# Patient Record
Sex: Female | Born: 2000 | Race: Black or African American | Hispanic: No | Marital: Single | Smoking: Never smoker
Health system: Southern US, Community
[De-identification: ages and names within clinical notes are randomized; demographics above are authoritative.]

## PROBLEM LIST (undated history)

## (undated) DIAGNOSIS — G43909 Migraine, unspecified, not intractable, without status migrainosus: Secondary | ICD-10-CM

## (undated) DIAGNOSIS — B9689 Other specified bacterial agents as the cause of diseases classified elsewhere: Secondary | ICD-10-CM

## (undated) DIAGNOSIS — N760 Acute vaginitis: Secondary | ICD-10-CM

## (undated) HISTORY — PX: NO PAST SURGERIES: SHX2092

---
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
## 2018-08-13 ENCOUNTER — Emergency Department (HOSPITAL_COMMUNITY)
Admission: EM | Admit: 2018-08-13 | Discharge: 2018-08-14 | Disposition: A | Discharge status: Home / Self Care | Payer: PRIVATE HEALTH INSURANCE | Attending: Emergency Medicine | Admitting: Emergency Medicine

## 2018-08-13 ENCOUNTER — Emergency Department (HOSPITAL_COMMUNITY): Payer: PRIVATE HEALTH INSURANCE

## 2018-08-13 ENCOUNTER — Other Ambulatory Visit: Payer: Self-pay

## 2018-08-13 DIAGNOSIS — J111 Influenza due to unidentified influenza virus with other respiratory manifestations: Secondary | ICD-10-CM | POA: Diagnosis not present

## 2018-08-13 DIAGNOSIS — Z79899 Other long term (current) drug therapy: Secondary | ICD-10-CM | POA: Insufficient documentation

## 2018-08-13 DIAGNOSIS — R69 Illness, unspecified: Secondary | ICD-10-CM

## 2018-08-13 DIAGNOSIS — M7918 Myalgia, other site: Secondary | ICD-10-CM | POA: Diagnosis present

## 2018-08-13 MED ORDER — ACETAMINOPHEN 325 MG PO TABS
650.0000 mg | ORAL_TABLET | Freq: Once | ORAL | Status: AC | PRN
  Administered 2018-08-13: 650 mg via ORAL
  Filled 2018-08-13: qty 2

## 2018-08-13 NOTE — ED Triage Notes (Signed)
Patient c/o body aches x 1 day, fever, HA and neck pain.

## 2018-08-14 LAB — GROUP A STREP BY PCR: Group A Strep by PCR: NOT DETECTED

## 2018-08-14 MED ORDER — OSELTAMIVIR PHOSPHATE 75 MG PO CAPS: 75.0000 mg | ORAL_CAPSULE | Freq: Two times a day (BID) | ORAL | 0 refills | Status: DC

## 2018-08-14 MED ORDER — NAPROXEN 500 MG PO TABS: 500.0000 mg | ORAL_TABLET | Freq: Two times a day (BID) | ORAL | 0 refills | Status: DC

## 2018-08-14 MED ORDER — OSELTAMIVIR PHOSPHATE 6 MG/ML PO SUSR: 75.0000 mg | Freq: Two times a day (BID) | ORAL | 0 refills | Status: AC

## 2018-08-14 NOTE — ED Notes (Signed)
Patient requesting rx for liquid tamiful, PA geiple made aware

## 2018-08-14 NOTE — ED Notes (Signed)
Mom called, wants an update when pt is brought to a room, at [phone]

## 2018-08-14 NOTE — ED Notes (Signed)
Patient verbalized understanding of dc papers and Rxs, vss, ambulatory upon discharge.

## 2018-08-14 NOTE — Discharge Instructions (Signed)
Please read and follow all provided instructions.  Your diagnoses today include:  1. Influenza-like illness     Tests performed today include:  Chest x-ray - no pneumonia  Strep test - negative  Vital signs. See below for your results today.   Medications prescribed:   Tamiflu - medication for influenza  This medication, when taken within the first 48 hours of illness, may help decrease the severity of the flu and cause symptoms to improve approximately 12 hours sooner. About 1 out of 5 patients may have diarrhea and vomiting from this medication.    Naproxen - anti-inflammatory pain medication  Do not exceed 500mg  naproxen every 12 hours, take with food  You have been prescribed an anti-inflammatory medication or NSAID. Take with food. Take smallest effective dose for the shortest duration needed for your pain. Stop taking if you experience stomach pain or vomiting.   Take any prescribed medications only as directed.  Home care instructions:  Follow any educational materials contained in this packet. Please continue drinking plenty of fluids. Use over-the-counter cold and flu medications as needed as directed on packaging for symptom relief. You may also use ibuprofen or tylenol as directed on packaging for pain or fever.   BE VERY CAREFUL not to take multiple medicines containing Tylenol (also called acetaminophen). Doing so can lead to an overdose which can damage your liver and cause liver failure and possibly death.   Follow-up instructions: Please follow-up with your primary care provider in the next 3 days for further evaluation of your symptoms.   Return instructions:   Please return to the Emergency Department if you experience worsening symptoms.  Please return if you have a high fever greater than 101 degrees not controlled with over-the-counter medications, persistent vomiting and cannot keep down fluids, or worsening trouble breathing.  Please return if you  have any other emergent concerns.  Additional Information:  Your vital signs today were: BP 105/66 (BP Location: Right Arm)    Pulse 73    Temp 98.5 F (36.9 C) (Oral)    Resp 17    Wt 70.8 kg    LMP  (LMP Unknown)    SpO2 98%    BMI 31.51 kg/m  If your blood pressure (BP) was elevated above 135/85 this visit, please have this repeated by your doctor within one month.

## 2018-08-14 NOTE — ED Provider Notes (Signed)
[facility] Provider Note   CSN: [id number] Arrival date & time: 08/13/18  2244     History   Chief Complaint Chief Complaint  Patient presents with  . Generalized Body Aches    HPI Sandra Herman is a 18 y.o. female.  Patient with no significant past medical history presents the emergency department with 1 day of fevers, congestion, generalized body aches.  Patient has not had any significant cough, nausea, vomiting, or diarrhea.  She denies ear pain.  No treatments prior to arrival.  No known sick contacts.  Patient denies neck pain, headache, or confusion. The onset of this condition was acute. The course is constant. Aggravating factors: none. Alleviating factors: none.       No past medical history on file.  There are no active problems to display for this patient.   No past surgical history on file.   OB History   No obstetric history on file.      Home Medications    Prior to Admission medications   Medication Sig Start Date End Date Taking? Authorizing Provider  acetaminophen (TYLENOL) 500 MG tablet Take 1,000 mg by mouth every 6 (six) hours as needed for headache (pain).    [provider]  etonogestrel (NEXPLANON) 68 MG IMPL implant 1 each by Subdermal route once. Implanted April 2018    [provider]  metroNIDAZOLE (FLAGYL) 500 MG tablet Take 1 tablet (500 mg total) by mouth 2 (two) times daily. 08/27/17   Hedges, Tinnie GensJeffrey, PA-C  naproxen (NAPROSYN) 500 MG tablet Take 1 tablet (500 mg total) by mouth 2 (two) times daily. 08/14/18   Renne CriglerGeiple, Chivas Notz, PA-C  oseltamivir (TAMIFLU) 75 MG capsule Take 1 capsule (75 mg total) by mouth every 12 (twelve) hours. 08/14/18   Renne CriglerGeiple, Channon Ambrosini, PA-C    Family History No family history on file.  Social History Social History   Tobacco Use  . Smoking status: Never Smoker  . Smokeless tobacco: Never Used  Substance Use Topics  . Alcohol use: No    Frequency:  Never  . Drug use: No     Allergies   Patient has no known allergies.   Review of Systems Review of Systems  Constitutional: Positive for chills, fatigue and fever.  HENT: Positive for congestion and sore throat. Negative for ear pain, rhinorrhea and sinus pressure.   Eyes: Negative for redness.  Respiratory: Negative for cough, shortness of breath and wheezing.   Gastrointestinal: Negative for abdominal pain, diarrhea, nausea and vomiting.  Genitourinary: Negative for dysuria.  Musculoskeletal: Positive for myalgias. Negative for neck stiffness.  Skin: Negative for rash.  Neurological: Negative for headaches.  Hematological: Negative for adenopathy.     Physical Exam Updated Vital Signs BP 105/66 (BP Location: Right Arm)   Pulse 73   Temp 98.5 F (36.9 C) (Oral)   Resp 17   Wt 70.8 kg   LMP  (LMP Unknown)   SpO2 98%   BMI 31.51 kg/m   Physical Exam Vitals signs and nursing note reviewed.  Constitutional:      Appearance: She is well-developed.  HENT:     Head: Normocephalic and atraumatic.     Jaw: No trismus.     Right Ear: Tympanic membrane, ear canal and external ear normal.     Left Ear: Tympanic membrane, ear canal and external ear normal.     Nose: Nose normal. No mucosal edema or rhinorrhea.     Mouth/Throat:  Mouth: Mucous membranes are not dry. No oral lesions.     Pharynx: Uvula midline. No oropharyngeal exudate, posterior oropharyngeal erythema or uvula swelling.     Tonsils: No tonsillar abscesses.  Eyes:     General:        Right eye: No discharge.        Left eye: No discharge.     Conjunctiva/sclera: Conjunctivae normal.  Neck:     Musculoskeletal: Normal range of motion and neck supple.     Comments: No signs of meningismus.  Cardiovascular:     Rate and Rhythm: Normal rate and regular rhythm.     Heart sounds: Normal heart sounds.  Pulmonary:     Effort: Pulmonary effort is normal. No respiratory distress.     Breath sounds: Normal  breath sounds. No wheezing or rales.  Abdominal:     Palpations: Abdomen is soft.     Tenderness: There is no abdominal tenderness.  Lymphadenopathy:     Cervical: No cervical adenopathy.  Skin:    General: Skin is warm and dry.  Neurological:     Mental Status: She is alert.      ED Treatments / Results  Labs (all labs ordered are listed, but only abnormal results are displayed) Labs Reviewed  GROUP A STREP BY PCR    EKG None  Radiology Dg Chest 2 View  Result Date: 08/13/2018 CLINICAL DATA:  Congestion EXAM: CHEST - 2 VIEW COMPARISON:  06/11/2017 FINDINGS: Heart and mediastinal contours are within normal limits. No focal opacities or effusions. No acute bony abnormality. IMPRESSION: No active cardiopulmonary disease. Electronically Signed   By: Charlett Nose M.D.   On: 08/13/2018 23:48    Procedures Procedures (including critical care time)  Medications Ordered in ED Medications  acetaminophen (TYLENOL) tablet 650 mg (650 mg Oral Given 08/13/18 2315)     Initial Impression / Assessment and Plan / ED Course  I have reviewed the triage vital signs and the nursing notes.  Pertinent labs & imaging results that were available during my care of the patient were reviewed by me and considered in my medical decision making (see chart for details).     Patient seen and examined.  Reviewed chest x-ray and strep results with patient.  Clinically she has the flu.  No worrisome signs of meningitis.  Heart and lungs are clear.  Vital signs reviewed and are as follows: BP 105/66 (BP Location: Right Arm)   Pulse 73   Temp 98.5 F (36.9 C) (Oral)   Resp 17   Wt 70.8 kg   LMP  (LMP Unknown)   SpO2 98%   BMI 31.51 kg/m   Patient will be treated for influenza presumptively.  Encouraged to rest and drink plenty of fluids.  Patient told to return to ED or see their primary doctor if their symptoms worsen, high fever not controlled with tylenol, persistent vomiting, they feel they  are dehydrated, or if they have any other concerns.  Patient verbalized understanding and agreed with plan.     Final Clinical Impressions(s) / ED Diagnoses   Final diagnoses:  Influenza-like illness   Patient with symptoms consistent with influenza. Vitals are stable, low-grade fever, improved. No signs of dehydration, tolerating PO's. Lungs are clear. Supportive therapy indicated with return if symptoms worsen. Patient counseled.   ED Discharge Orders         Ordered    oseltamivir (TAMIFLU) 75 MG capsule  Every 12 hours     08/14/18  0711    naproxen (NAPROSYN) 500 MG tablet  2 times daily     08/14/18 0711           Renne CriglerGeiple, Jenille Laszlo, PA-C 08/14/18 Meda [id number]    Isaacs, Cameron, MD 08/14/18 [phone]

## 2019-04-12 ENCOUNTER — Emergency Department (HOSPITAL_COMMUNITY)
Admission: EM | Admit: 2019-04-12 | Discharge: 2019-04-12 | Disposition: A | Discharge status: Home / Self Care | Payer: PRIVATE HEALTH INSURANCE | Attending: Emergency Medicine | Admitting: Emergency Medicine

## 2019-04-12 ENCOUNTER — Encounter (HOSPITAL_COMMUNITY): Payer: Self-pay | Admitting: *Deleted

## 2019-04-12 ENCOUNTER — Other Ambulatory Visit: Payer: Self-pay

## 2019-04-12 DIAGNOSIS — L509 Urticaria, unspecified: Secondary | ICD-10-CM | POA: Insufficient documentation

## 2019-04-12 DIAGNOSIS — R21 Rash and other nonspecific skin eruption: Secondary | ICD-10-CM | POA: Diagnosis present

## 2019-04-12 HISTORY — DX: Migraine, unspecified, not intractable, without status migrainosus: G43.909

## 2019-04-12 HISTORY — DX: Other specified bacterial agents as the cause of diseases classified elsewhere: B96.89

## 2019-04-12 MED ORDER — PREDNISONE 10 MG PO TABS: 40.0000 mg | ORAL_TABLET | Freq: Every day | ORAL | 0 refills | Status: AC

## 2019-04-12 MED ORDER — HYDROXYZINE HCL 25 MG PO TABS
25.0000 mg | ORAL_TABLET | Freq: Once | ORAL | Status: AC
  Administered 2019-04-12: 25 mg via ORAL
  Filled 2019-04-12: qty 1

## 2019-04-12 MED ORDER — PREDNISONE 20 MG PO TABS
60.0000 mg | ORAL_TABLET | Freq: Once | ORAL | Status: AC
  Administered 2019-04-12: 60 mg via ORAL
  Filled 2019-04-12: qty 3

## 2019-04-12 MED ORDER — HYDROXYZINE HCL 25 MG PO TABS: 25.0000 mg | ORAL_TABLET | Freq: Four times a day (QID) | ORAL | 0 refills | Status: AC

## 2019-04-12 NOTE — ED Triage Notes (Signed)
C/o rash over entire body x 1 weeks no change didn't come yest because she didn't want to wait.

## 2019-04-12 NOTE — Discharge Instructions (Signed)
You may use over-the-counter hydrocortisone cream on your skin for relief as well.  Take medication as prescribed.  Return if you have any new or worsening symptoms.

## 2019-04-12 NOTE — ED Provider Notes (Signed)
[place] EMERGENCY DEPARTMENT Provider Note   CSN: [id number] Arrival date & time: 04/12/19  1031     History   Chief Complaint Chief Complaint  Patient presents with  . Rash    HPI Sandra Herman is a 18 y.o. female.     Patient is a 18 year old female possible history of migraine headache presenting to emergency department for rash for 1 week.  Reports this started on reports.  Positive back pain, sometimes body.  Reports that it is very itchy and exacerbated by heat and warm water.  Has not tried any relief.  No known irritants or exposures to new soaps, lotions, detergents etc.  Denies any trouble breathing or swallowing.  Denies any wheezing.     Past Medical History:  Diagnosis Date  . BV (bacterial vaginosis)   . Migraine     There are no active problems to display for this patient.   History reviewed. No pertinent surgical history.   OB History   No obstetric history on file.      Home Medications    Prior to Admission medications   Medication Sig Start Date End Date Taking? Authorizing Provider  acetaminophen (TYLENOL) 500 MG tablet Take 1,000 mg by mouth every 6 (six) hours as needed for headache (pain).    [provider]  etonogestrel (NEXPLANON) 68 MG IMPL implant 1 each by Subdermal route once. Implanted April 2018    [provider]  hydrOXYzine (ATARAX/VISTARIL) 25 MG tablet Take 1 tablet (25 mg total) by mouth every 6 (six) hours for 7 days. 04/12/19 04/19/19  Madilyn Hook A, PA-C  metroNIDAZOLE (FLAGYL) 500 MG tablet Take 1 tablet (500 mg total) by mouth 2 (two) times daily. 08/27/17   Hedges, Dellis Filbert, PA-C  naproxen (NAPROSYN) 500 MG tablet Take 1 tablet (500 mg total) by mouth 2 (two) times daily. 08/14/18   Carlisle Cater, PA-C  predniSONE (DELTASONE) 10 MG tablet Take 4 tablets (40 mg total) by mouth daily for 5 days. 04/12/19 04/17/19  Alveria Apley, PA-C    Family History No family history on file.   Social History Social History   Tobacco Use  . Smoking status: Never Smoker  . Smokeless tobacco: Never Used  Substance Use Topics  . Alcohol use: Yes    Frequency: Never  . Drug use: No     Allergies   Patient has no known allergies.   Review of Systems Review of Systems  Constitutional: Negative for appetite change, chills, diaphoresis, fatigue and fever.  Respiratory: Negative for cough and shortness of breath.   Cardiovascular: Negative for chest pain.  Gastrointestinal: Negative for nausea and vomiting.  Genitourinary: Negative for dysuria.  Musculoskeletal: Negative for arthralgias, back pain and myalgias.  Skin: Positive for rash. Negative for color change, pallor and wound.  Neurological: Negative for dizziness and light-headedness.  All other systems reviewed and are negative.    Physical Exam Updated Vital Signs BP 122/76 (BP Location: Right Arm)   Pulse 93   Temp 98.4 F (36.9 C) (Oral)   Resp 16   Ht 4\' 11"  (1.499 m)   Wt 68 kg   SpO2 100%   BMI 30.30 kg/m   Physical Exam Vitals signs and nursing note reviewed.  Constitutional:      General: She is not in acute distress.    Appearance: Normal appearance. She is not ill-appearing, toxic-appearing or diaphoretic.  HENT:     Head: Normocephalic and atraumatic.  Nose: Nose normal.     Mouth/Throat:     Mouth: Mucous membranes are moist.     Pharynx: No oropharyngeal exudate or posterior oropharyngeal erythema.  Eyes:     Conjunctiva/sclera: Conjunctivae normal.  Pulmonary:     Effort: Pulmonary effort is normal.  Skin:    General: Skin is dry.     Comments: Multiple hives all over the patient's torso, arms and hands, inguinal area.  No signs of swelling, drainage, erythema.  Neurological:     Mental Status: She is alert.  Psychiatric:        Mood and Affect: Mood normal.      ED Treatments / Results  Labs (all labs ordered are listed, but only abnormal results are displayed) Labs  Reviewed - No data to display  EKG None  Radiology No results found.  Procedures Procedures (including critical care time)  Medications Ordered in ED Medications  predniSONE (DELTASONE) tablet 60 mg (has no administration in time range)  hydrOXYzine (ATARAX/VISTARIL) tablet 25 mg (has no administration in time range)     Initial Impression / Assessment and Plan / ED Course  I have reviewed the triage vital signs and the nursing notes.  Pertinent labs & imaging results that were available during my care of the patient were reviewed by me and considered in my medical decision making (see chart for details).  Clinical Course as of Apr 11 1200  Sat Apr 12, 2019  1159 Patient here for rash for 1 week.  Appears to be hives.  Unknown cause.  Patient does not have any signs of anaphylaxis.  Airway is normal.  Monocytes normal.  Will treat with steroids and Atarax.  Advised to follow-up with allergy specialist for allergy testing.   [KM]    Clinical Course User Index [KM] Arlyn Dunning, PA-C       Based on review of vitals, medical screening exam, lab work and/or imaging, there does not appear to be an acute, emergent etiology for the patient's symptoms. Counseled pt on good return precautions and encouraged both PCP and ED follow-up as needed.  Prior to discharge, I also discussed incidental imaging findings with patient in detail and advised appropriate, recommended follow-up in detail.  Clinical Impression: 1. Hives     Disposition: Discharge  Prior to providing a prescription for a controlled substance, I independently reviewed the patient's recent prescription history on the [place] Controlled Substance Reporting System. The patient had no recent or regular prescriptions and was deemed appropriate for a brief, less than 3 day prescription of narcotic for acute analgesia.  This note was prepared with assistance of Conservation officer, historic buildings. Occasional  wrong-word or sound-a-like substitutions may have occurred due to the inherent limitations of voice recognition software.   Final Clinical Impressions(s) / ED Diagnoses   Final diagnoses:  Hives    ED Discharge Orders         Ordered    predniSONE (DELTASONE) 10 MG tablet  Daily     04/12/19 1200    hydrOXYzine (ATARAX/VISTARIL) 25 MG tablet  Every 6 hours     04/12/19 1200           Jeral Pinch 04/12/19 1201    Arby Barrette, MD 04/22/19 [phone]

## 2019-05-02 ENCOUNTER — Ambulatory Visit: Payer: PRIVATE HEALTH INSURANCE | Admitting: Allergy

## 2019-05-23 ENCOUNTER — Other Ambulatory Visit: Payer: Self-pay

## 2019-05-23 ENCOUNTER — Ambulatory Visit: Payer: PRIVATE HEALTH INSURANCE | Admitting: Allergy

## 2019-09-12 ENCOUNTER — Other Ambulatory Visit: Payer: Self-pay

## 2019-09-12 ENCOUNTER — Encounter (HOSPITAL_COMMUNITY): Payer: Self-pay | Admitting: Emergency Medicine

## 2019-09-12 ENCOUNTER — Emergency Department (HOSPITAL_COMMUNITY)
Admission: EM | Admit: 2019-09-12 | Discharge: 2019-09-12 | Disposition: A | Discharge status: Home / Self Care | Payer: PRIVATE HEALTH INSURANCE | Attending: Emergency Medicine | Admitting: Emergency Medicine

## 2019-09-12 DIAGNOSIS — Z79899 Other long term (current) drug therapy: Secondary | ICD-10-CM | POA: Diagnosis not present

## 2019-09-12 DIAGNOSIS — Z793 Long term (current) use of hormonal contraceptives: Secondary | ICD-10-CM | POA: Insufficient documentation

## 2019-09-12 DIAGNOSIS — H9201 Otalgia, right ear: Secondary | ICD-10-CM | POA: Diagnosis present

## 2019-09-12 DIAGNOSIS — M26621 Arthralgia of right temporomandibular joint: Secondary | ICD-10-CM | POA: Diagnosis not present

## 2019-09-12 MED ORDER — NAPROXEN 500 MG PO TABS: 500.0000 mg | ORAL_TABLET | Freq: Two times a day (BID) | ORAL | 0 refills | Status: DC

## 2019-09-12 NOTE — ED Provider Notes (Signed)
[place] EMERGENCY DEPARTMENT Provider Note   CSN: [id number] Arrival date & time: 09/12/19  1327     History Chief Complaint  Patient presents with  . Otalgia    Kennadie Brenner is a 19 y.o. female.  Tamaria Dunleavy is a 19 y.o. female with a history of migraines, who presents to the emergency department for evaluation of pain in front of her right ear.  She reports pain has been present for about 3 weeks.  She reports she hears clicking and popping and pain is worse when she is trying to chew and eat.  She denies pain inside the ear reports pain is more so in front of her ear at the jaw joint.  She is also had some associated headaches, no numbness tingling or weakness of the face.  No visual changes.  No dental pain or sore throat.  No fevers or chills.  No redness or swelling of the skin in this area.  She has tried ibuprofen without much relief.  She also was told recently by her mom that she grinds her teeth in her sleep and wonders if this could be contributing.  She is here for school and her dentist is in [place], she has not seen anyone about this complaint.  The history is provided by the patient.       Past Medical History:  Diagnosis Date  . BV (bacterial vaginosis)   . Migraine     There are no problems to display for this patient.   History reviewed. No pertinent surgical history.   OB History   No obstetric history on file.     No family history on file.  Social History   Tobacco Use  . Smoking status: Never Smoker  . Smokeless tobacco: Never Used  Substance Use Topics  . Alcohol use: Not Currently  . Drug use: No    Home Medications Prior to Admission medications   Medication Sig Start Date End Date Taking? Authorizing Provider  acetaminophen (TYLENOL) 500 MG tablet Take 1,000 mg by mouth every 6 (six) hours as needed for headache (pain).    [provider]  etonogestrel (NEXPLANON) 68 MG IMPL implant 1 each by  Subdermal route once. Implanted April 2018    [provider]  metroNIDAZOLE (FLAGYL) 500 MG tablet Take 1 tablet (500 mg total) by mouth 2 (two) times daily. 08/27/17   Hedges, Dellis Filbert, PA-C  naproxen (NAPROSYN) 500 MG tablet Take 1 tablet (500 mg total) by mouth 2 (two) times daily. 09/12/19   Jacqlyn Larsen, PA-C    Allergies    Patient has no known allergies.  Review of Systems   Review of Systems  Constitutional: Negative for chills and fever.  HENT: Positive for ear pain. Negative for congestion, ear discharge, rhinorrhea, sore throat and trouble swallowing.   Gastrointestinal: Negative for nausea and vomiting.  Musculoskeletal: Negative for neck pain and neck stiffness.  Skin: Negative for color change and rash.  Neurological: Positive for headaches.    Physical Exam Updated Vital Signs BP 126/69 (BP Location: Left Arm)   Pulse 72   Temp 97.7 F (36.5 C) (Oral)   Resp 16   Ht 4\' 11"  (1.499 m)   Wt 72.6 kg   SpO2 99%   BMI 32.32 kg/m   Physical Exam Vitals and nursing note reviewed.  Constitutional:      General: She is not in acute distress.    Appearance: Normal appearance. She is well-developed and  normal weight. She is not ill-appearing or diaphoretic.  HENT:     Head: Normocephalic and atraumatic.     Right Ear: Tympanic membrane, ear canal and external ear normal.     Left Ear: Tympanic membrane, ear canal and external ear normal.     Mouth/Throat:     Comments: Tenderness over the right TMJ joint, popping felt with movement of the jaw, no erythema swelling or abscesses noted in the mouth, no sublingual tenderness, no trismus.  No skin changes over the TMJ joint, no palpable fluctuance or abscess. Eyes:     General:        Right eye: No discharge.        Left eye: No discharge.  Pulmonary:     Effort: Pulmonary effort is normal. No respiratory distress.  Musculoskeletal:     Cervical back: Neck supple.  Skin:    General: Skin is warm and dry.    Neurological:     Mental Status: She is alert and oriented to person, place, and time.     Coordination: Coordination normal.  Psychiatric:        Behavior: Behavior normal.     ED Results / Procedures / Treatments   Labs (all labs ordered are listed, but only abnormal results are displayed) Labs Reviewed - No data to display  EKG None  Radiology No results found.  Procedures Procedures (including critical care time)  Medications Ordered in ED Medications - No data to display  ED Course  I have reviewed the triage vital signs and the nursing notes.  Pertinent labs & imaging results that were available during my care of the patient were reviewed by me and considered in my medical decision making (see chart for details).    MDM Rules/Calculators/A&P                      19 year old female presents with tenderness over the TMJ joint associated with popping and clicking, pain is worse with chewing.  She has no overlying swelling or skin changes and no evidence of abscess or infection in the mouth.  No associated neck pain or lymphadenopathy.  No evidence of ear infection.  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  I have asked the patient to take naproxen regularly and purchase an over-the-counter mouthguard and encouraged her to follow-up with her dentist.  Return precautions discussed.  Discharged home in good condition.  Final Clinical Impression(s) / ED Diagnoses Final diagnoses:  TMJ tenderness, right    Rx / DC Orders ED Discharge Orders         Ordered    naproxen (NAPROSYN) 500 MG tablet  2 times daily     09/12/19 1519           Legrand Rams 09/12/19 1934    Jacalyn Lefevre, MD 09/18/19 1200

## 2019-09-12 NOTE — ED Triage Notes (Signed)
C/o right ear pain-- started approx 3 weeks ago-- states her mom told her that she grinds her teeth at night. Hurts to chew also

## 2019-09-12 NOTE — Discharge Instructions (Signed)
I suspect your pain is due to inflammation in your TMJ joint, this can certainly be worsened by teeth grinding.  Please purchase an over-the-counter mouthguard.  Please also take naproxen twice daily with food.  It is important that you follow-up with your dentist if you are continuing to have the symptoms.

## 2020-03-18 ENCOUNTER — Encounter (HOSPITAL_COMMUNITY): Payer: Self-pay

## 2020-03-18 ENCOUNTER — Emergency Department (HOSPITAL_COMMUNITY)
Admission: EM | Admit: 2020-03-18 | Discharge: 2020-03-18 | Disposition: A | Discharge status: Home / Self Care | Payer: PRIVATE HEALTH INSURANCE | Attending: Emergency Medicine | Admitting: Emergency Medicine

## 2020-03-18 DIAGNOSIS — Z5321 Procedure and treatment not carried out due to patient leaving prior to being seen by health care provider: Secondary | ICD-10-CM | POA: Insufficient documentation

## 2020-03-18 DIAGNOSIS — M791 Myalgia, unspecified site: Secondary | ICD-10-CM | POA: Diagnosis not present

## 2020-03-18 LAB — BASIC METABOLIC PANEL
Anion gap: 10 (ref 5–15)
BUN: 9 mg/dL (ref 6–20)
CO2: 24 mmol/L (ref 22–32)
Calcium: 8.9 mg/dL (ref 8.9–10.3)
Chloride: 103 mmol/L (ref 98–111)
Creatinine, Ser: 0.87 mg/dL (ref 0.44–1.00)
GFR calc Af Amer: >60 mL/min (ref >60)
GFR calc non Af Amer: >60 mL/min (ref >60)
Glucose, Bld: 93 mg/dL (ref 70–99)
Potassium: 3.6 mmol/L (ref 3.5–5.1)
Sodium: 137 mmol/L (ref 135–145)

## 2020-03-18 NOTE — ED Notes (Signed)
Pt told registration they were leaving 

## 2020-03-18 NOTE — ED Triage Notes (Signed)
Pt reports generalized body aches and facial pain for the past week, headaches. Was tested for covid and told it was negative today. Hx of migraines

## 2020-03-19 LAB — CBC
HCT: 37.8 % (ref 36.0–46.0)
Hemoglobin: 11.8 g/dL — ABNORMAL LOW (ref 12.0–15.0)
MCH: 28.9 pg (ref 26.0–34.0)
MCHC: 31.2 g/dL (ref 30.0–36.0)
MCV: 92.6 fL (ref 80.0–100.0)
Platelets: 177 10*3/uL (ref 150–400)
RBC: 4.08 MIL/uL (ref 3.87–5.11)
RDW: 11.9 % (ref 11.5–15.5)
WBC: 4.3 10*3/uL (ref 4.0–10.5)
nRBC: 0 % (ref 0.0–0.2)

## 2020-05-11 ENCOUNTER — Other Ambulatory Visit: Payer: Self-pay

## 2020-05-11 DIAGNOSIS — O3680X0 Pregnancy with inconclusive fetal viability, not applicable or unspecified: Secondary | ICD-10-CM

## 2020-05-13 ENCOUNTER — Telehealth: Payer: Self-pay

## 2020-05-13 NOTE — Telephone Encounter (Signed)
Called pt and spoke with her in regards to f/u from [facility].  Pt stated that she came to [address] In which from someone from the front office scheduled her an U/S appt for 05/24/20 @ 1500.  Pt denies vaginal bleeding or pain.  Pt advised that we will determine her f/u after her U/S appt  Pt also advised that if she starts bleeding like a period or has intense pain to please go to [facility].  Pt verbalized understanding with no further questions.   Addison Naegeli, RN  05/13/20

## 2020-05-18 ENCOUNTER — Inpatient Hospital Stay (HOSPITAL_COMMUNITY): Payer: PRIVATE HEALTH INSURANCE

## 2020-05-18 ENCOUNTER — Encounter (HOSPITAL_COMMUNITY): Payer: Self-pay | Admitting: Obstetrics and Gynecology

## 2020-05-18 ENCOUNTER — Inpatient Hospital Stay (HOSPITAL_COMMUNITY)
Admission: AD | Admit: 2020-05-18 | Discharge: 2020-05-18 | Disposition: A | Discharge status: Home / Self Care | Payer: PRIVATE HEALTH INSURANCE | Attending: Obstetrics and Gynecology | Admitting: Obstetrics and Gynecology

## 2020-05-18 ENCOUNTER — Other Ambulatory Visit: Payer: Self-pay

## 2020-05-18 ENCOUNTER — Telehealth: Payer: Self-pay | Admitting: Nurse Practitioner

## 2020-05-18 DIAGNOSIS — Z3A01 Less than 8 weeks gestation of pregnancy: Secondary | ICD-10-CM | POA: Diagnosis not present

## 2020-05-18 DIAGNOSIS — O039 Complete or unspecified spontaneous abortion without complication: Secondary | ICD-10-CM | POA: Diagnosis not present

## 2020-05-18 DIAGNOSIS — O209 Hemorrhage in early pregnancy, unspecified: Secondary | ICD-10-CM | POA: Insufficient documentation

## 2020-05-18 DIAGNOSIS — Z6790 Unspecified blood type, Rh positive: Secondary | ICD-10-CM | POA: Diagnosis not present

## 2020-05-18 DIAGNOSIS — O4690 Antepartum hemorrhage, unspecified, unspecified trimester: Secondary | ICD-10-CM

## 2020-05-18 DIAGNOSIS — O4691 Antepartum hemorrhage, unspecified, first trimester: Secondary | ICD-10-CM

## 2020-05-18 DIAGNOSIS — Z3490 Encounter for supervision of normal pregnancy, unspecified, unspecified trimester: Secondary | ICD-10-CM

## 2020-05-18 LAB — COMPREHENSIVE METABOLIC PANEL
ALT: 25 U/L (ref 0–44)
AST: 25 U/L (ref 15–41)
Albumin: 3.9 g/dL (ref 3.5–5.0)
Alkaline Phosphatase: 40 U/L (ref 38–126)
Anion gap: 6 (ref 5–15)
BUN: 11 mg/dL (ref 6–20)
CO2: 27 mmol/L (ref 22–32)
Calcium: 9.4 mg/dL (ref 8.9–10.3)
Chloride: 104 mmol/L (ref 98–111)
Creatinine, Ser: 0.66 mg/dL (ref 0.44–1.00)
GFR, Estimated: >60 mL/min (ref >60)
Glucose, Bld: 92 mg/dL (ref 70–99)
Potassium: 3.7 mmol/L (ref 3.5–5.1)
Sodium: 137 mmol/L (ref 135–145)
Total Bilirubin: 0.4 mg/dL (ref 0.3–1.2)
Total Protein: 7.7 g/dL (ref 6.5–8.1)

## 2020-05-18 LAB — URINALYSIS, ROUTINE W REFLEX MICROSCOPIC
Bilirubin Urine: NEGATIVE
Glucose, UA: NEGATIVE mg/dL
Ketones, ur: NEGATIVE mg/dL
Leukocytes,Ua: NEGATIVE
Nitrite: NEGATIVE
Protein, ur: NEGATIVE mg/dL
Specific Gravity, Urine: >1.03 — ABNORMAL HIGH (ref 1.005–1.030)
pH: 5.5 (ref 5.0–8.0)

## 2020-05-18 LAB — CBC
HCT: 37 % (ref 36.0–46.0)
Hemoglobin: 12 g/dL (ref 12.0–15.0)
MCH: 29.9 pg (ref 26.0–34.0)
MCHC: 32.4 g/dL (ref 30.0–36.0)
MCV: 92.3 fL (ref 80.0–100.0)
Platelets: 222 10*3/uL (ref 150–400)
RBC: 4.01 MIL/uL (ref 3.87–5.11)
RDW: 12.6 % (ref 11.5–15.5)
WBC: 11.5 10*3/uL — ABNORMAL HIGH (ref 4.0–10.5)
nRBC: 0 % (ref 0.0–0.2)

## 2020-05-18 LAB — HCG, QUANTITATIVE, PREGNANCY: hCG, Beta Chain, Quant, S: 4369 m[IU]/mL — ABNORMAL HIGH (ref <5)

## 2020-05-18 LAB — WET PREP, GENITAL
Clue Cells Wet Prep HPF POC: NONE SEEN
Sperm: NONE SEEN
Trich, Wet Prep: NONE SEEN
Yeast Wet Prep HPF POC: NONE SEEN

## 2020-05-18 LAB — URINALYSIS, MICROSCOPIC (REFLEX): RBC / HPF: NONE SEEN RBC/hpf (ref 0–5)

## 2020-05-18 LAB — ABO/RH: ABO/RH(D): B POS

## 2020-05-18 LAB — POCT PREGNANCY, URINE: Preg Test, Ur: POSITIVE — AB

## 2020-05-18 NOTE — Discharge Instructions (Signed)
Miscarriage A miscarriage is the loss of an unborn baby (fetus) before the 20th week of pregnancy. Most miscarriages happen during the first 3 months of pregnancy. Sometimes, a miscarriage can happen before a woman knows that she is pregnant. Having a miscarriage can be an emotional experience. If you have had a miscarriage, talk with your health care provider about any questions you may have about miscarrying, the grieving process, and your plans for future pregnancy. What are the causes? A miscarriage may be caused by:  Problems with the genes or chromosomes of the fetus. These problems make it impossible for the baby to develop normally. They are often the result of random errors that occur early in the development of the baby, and are not passed from parent to child (not inherited).  Infection of the cervix or uterus.  Conditions that affect hormone balance in the body.  Problems with the cervix, such as the cervix opening and thinning before pregnancy is at term (cervical insufficiency).  Problems with the uterus. These may include: ? A uterus with an abnormal shape. ? Fibroids in the uterus. ? Congenital abnormalities. These are problems that were present at birth.  Certain medical conditions.  Smoking, drinking alcohol, or using drugs.  Injury (trauma). In many cases, the cause of a miscarriage is not known. What are the signs or symptoms? Symptoms of this condition include:  Vaginal bleeding or spotting, with or without cramps or pain.  Pain or cramping in the abdomen or lower back.  Passing fluid, tissue, or blood clots from the vagina. How is this diagnosed? This condition may be diagnosed based on:  A physical exam.  Ultrasound.  Blood tests.  Urine tests. How is this treated? Treatment for a miscarriage is sometimes not necessary if you naturally pass all the tissue that was in your uterus. If necessary, this condition may be treated with:  Dilation and  curettage (D&C). This is a procedure in which the cervix is stretched open and the lining of the uterus (endometrium) is scraped. This is done only if tissue from the fetus or placenta remains in the body (incomplete miscarriage).  Medicines, such as: ? Antibiotic medicine, to treat infection. ? Medicine to help the body pass any remaining tissue. ? Medicine to reduce (contract) the size of the uterus. These medicines may be given if you have a lot of bleeding. If you have Rh negative blood and your baby was Rh positive, you will need a shot of a medicine called Rh immunoglobulinto protect your future babies from Rh blood problems. "Rh-negative" and "Rh-positive" refer to whether or not the blood has a specific protein found on the surface of red blood cells (Rh factor). Follow these instructions at home: Medicines   Take over-the-counter and prescription medicines only as told by your health care provider.  If you were prescribed antibiotic medicine, take it as told by your health care provider. Do not stop taking the antibiotic even if you start to feel better.  Do not take NSAIDs, such as aspirin and ibuprofen, unless they are approved by your health care provider. These medicines can cause bleeding. Activity  Rest as directed. Ask your health care provider what activities are safe for you.  Have someone help with home and family responsibilities during this time. General instructions  Keep track of the number of sanitary pads you use each day and how soaked (saturated) they are. Write down this information.  Monitor the amount of tissue or blood clots that   you pass from your vagina. Save any large amounts of tissue for your health care provider to examine.  Do not use tampons, douche, or have sex until your health care provider approves.  To help you and your partner with the process of grieving, talk with your health care provider or seek counseling.  When you are ready, meet with  your health care provider to discuss any important steps you should take for your health. Also, discuss steps you should take to have a healthy pregnancy in the future.  Keep all follow-up visits as told by your health care provider. This is important. Where to find more information  The American Congress of Obstetricians and Gynecologists: www.acog.org  [place] Department of Health and Cytogeneticist of Women's Health: http://hoffman.com/ Contact a health care provider if:  You have a fever or chills.  You have a foul smelling vaginal discharge.  You have more bleeding instead of less. Get help right away if:  You have severe cramps or pain in your back or abdomen.  You pass blood clots or tissue from your vagina that is walnut-sized or larger.  You soak more than 1 regular sanitary pad in an hour.  You become light-headed or weak.  You pass out.  You have feelings of sadness that take over your thoughts, or you have thoughts of hurting yourself. Summary  Most miscarriages happen in the first 3 months of pregnancy. Sometimes miscarriage happens before a woman even knows that she is pregnant.  Follow your health care provider's instruction for home care. Keep all follow-up appointments.  To help you and your partner with the process of grieving, talk with your health care provider or seek counseling. This information is not intended to replace advice given to you by your health care provider. Make sure you discuss any questions you have with your health care provider. Document Revised: 10/25/2018 Document Reviewed: 08/08/2016 Elsevier Patient Education  2020 [employer] Inc.          Vaginal Bleeding During Pregnancy, First Trimester  A small amount of bleeding from the vagina (spotting) is relatively common during early pregnancy. It usually stops on its own. Various things may cause bleeding or spotting during early pregnancy. Some bleeding may be related to the  pregnancy, and some may not. In many cases, the bleeding is normal and is not a problem. However, bleeding can also be a sign of something serious. Be sure to tell your health care provider about any vaginal bleeding right away. Some possible causes of vaginal bleeding during the first trimester include:  Infection or inflammation of the cervix.  Growths (polyps) on the cervix.  Miscarriage or threatened miscarriage.  Pregnancy tissue developing outside of the uterus (ectopic pregnancy).  A mass of tissue developing in the uterus due to an egg being fertilized incorrectly (molar pregnancy). Follow these instructions at home: Activity  Follow instructions from your health care provider about limiting your activity. Ask what activities are safe for you.  If needed, make plans for someone to help with your regular activities.  Do not have sex or orgasms until your health care provider says that this is safe. General instructions  Take over-the-counter and prescription medicines only as told by your health care provider.  Pay attention to any changes in your symptoms.  Do not use tampons or douche.  Write down how many pads you use each day, how often you change pads, and how soaked (saturated) they are.  If you pass  any tissue from your vagina, save the tissue so you can show it to your health care provider.  Keep all follow-up visits as told by your health care provider. This is important. Contact a health care provider if:  You have vaginal bleeding during any part of your pregnancy.  You have cramps or labor pains.  You have a fever. Get help right away if:  You have severe cramps in your back or abdomen.  You pass large clots or a large amount of tissue from your vagina.  Your bleeding increases.  You feel light-headed or weak, or you faint.  You have chills.  You are leaking fluid or have a gush of fluid from your vagina. Summary  A small amount of bleeding  (spotting) from the vagina is relatively common during early pregnancy.  Various things may cause bleeding or spotting in early pregnancy.  Be sure to tell your health care provider about any vaginal bleeding right away. This information is not intended to replace advice given to you by your health care provider. Make sure you discuss any questions you have with your health care provider. Document Revised: 10/22/2018 Document Reviewed: 10/05/2016 Elsevier Patient Education  2020 Elsevier Inc.                          Safe Medications in Pregnancy    Acne: Benzoyl Peroxide Salicylic Acid  Backache/Headache: Tylenol: 2 regular strength every 4 hours OR              2 Extra strength every 6 hours  Colds/Coughs/Allergies: Benadryl (alcohol free) 25 mg every 6 hours as needed Breath right strips Claritin Cepacol throat lozenges Chloraseptic throat spray Cold-Eeze- up to three times per day Cough drops, alcohol free Flonase (by prescription only) Guaifenesin Mucinex Robitussin DM (plain only, alcohol free) Saline nasal spray/drops Sudafed (pseudoephedrine) & Actifed ** use only after [redacted] weeks gestation and if you do not have high blood pressure Tylenol Vicks Vaporub Zinc lozenges Zyrtec   Constipation: Colace Ducolax suppositories Fleet enema Glycerin suppositories Metamucil Milk of magnesia Miralax Senokot Smooth move tea  Diarrhea: Kaopectate Imodium A-D  *NO pepto Bismol  Hemorrhoids: Anusol Anusol HC Preparation H Tucks  Indigestion: Tums Maalox Mylanta Zantac  Pepcid  Insomnia: Benadryl (alcohol free) 25mg  every 6 hours as needed Tylenol PM Unisom, no Gelcaps  Leg Cramps: Tums MagGel  Nausea/Vomiting:  Bonine Dramamine Emetrol Ginger extract Sea bands Meclizine  Nausea medication to take during pregnancy:  Unisom (doxylamine succinate 25 mg tablets) Take one tablet daily at bedtime. If symptoms are not adequately controlled,  the dose can be increased to a maximum recommended dose of two tablets daily (1/2 tablet in the morning, 1/2 tablet mid-afternoon and one at bedtime). Vitamin B6 100mg  tablets. Take one tablet twice a day (up to 200 mg per day).  Skin Rashes: Aveeno products Benadryl cream or 25mg  every 6 hours as needed Calamine Lotion 1% cortisone cream  Yeast infection: Gyne-lotrimin 7 Monistat 7   **If taking multiple medications, please check labels to avoid duplicating the same active ingredients **take medication as directed on the label ** Do not exceed 4000 mg of tylenol in 24 hours **Do not take medications that contain aspirin or ibuprofen

## 2020-05-18 NOTE — Telephone Encounter (Signed)
Patient being seen in MAU currently.

## 2020-05-18 NOTE — Telephone Encounter (Signed)
Early pregnant and bleeding. Not a patient here.

## 2020-05-18 NOTE — MAU Provider Note (Signed)
History     CSN: [id number]  Arrival date and time: 05/18/20 1339   None     Chief Complaint  Patient presents with  . Vaginal Bleeding  . Cramping  . Headache   Ms. Sandra Herman is a 19 y.o. G1P0 at [redacted]w[redacted]d who presents to MAU for vaginal bleeding which began last night into today. Patient reports last night the blood was pink, but today it was "red and clotty." Pt reports when she used the restroom in MAU, she saw some blood tinged mucus. Patient shows provider a photo of the clots, which were about half the size of a dime or smaller.  Passing blood clots? Per above Blood soaking clothes? no Lightheaded/dizzy? no Significant pelvic pain or cramping? Menstrual-like cramping Passed any tissue? no Hx ectopic pregnancy? no  Current pregnancy problems? Pt has not yet been seen Blood Type? unknown Allergies? NKDA Current medications? Tylenol Current PNC & next appt? Pt requests list of OB providers  Pt denies vaginal discharge/odor/itching. Pt denies N/V, abdominal pain, constipation, diarrhea, or urinary problems. Pt denies fever, chills, fatigue, sweating or changes in appetite. Pt denies SOB or chest pain. Pt denies dizziness, HA, light-headedness, weakness.   OB History    Gravida  1   Para      Term      Preterm      AB      Living        SAB      TAB      Ectopic      Multiple      Live Births              Past Medical History:  Diagnosis Date  . BV (bacterial vaginosis)   . Migraine     History reviewed. No pertinent surgical history.  History reviewed. No pertinent family history.  Social History   Tobacco Use  . Smoking status: Never Smoker  . Smokeless tobacco: Never Used  Vaping Use  . Vaping Use: Never used  Substance Use Topics  . Alcohol use: Not Currently  . Drug use: No    Allergies: No Known Allergies  Medications Prior to Admission  Medication Sig Dispense Refill Last Dose  . acetaminophen (TYLENOL) 500 MG  tablet Take 1,000 mg by mouth every 6 (six) hours as needed for headache (pain).   05/18/2020 at Unknown time  . etonogestrel (NEXPLANON) 68 MG IMPL implant 1 each by Subdermal route once. Implanted April 2018     . metroNIDAZOLE (FLAGYL) 500 MG tablet Take 1 tablet (500 mg total) by mouth 2 (two) times daily. 20 tablet 0   . naproxen (NAPROSYN) 500 MG tablet Take 1 tablet (500 mg total) by mouth 2 (two) times daily. 30 tablet 0     Review of Systems  Constitutional: Negative for chills, diaphoresis, fatigue and fever.  Eyes: Negative for visual disturbance.  Respiratory: Negative for shortness of breath.   Cardiovascular: Negative for chest pain.  Gastrointestinal: Negative for abdominal pain, constipation, diarrhea, nausea and vomiting.  Genitourinary: Positive for pelvic pain and vaginal bleeding. Negative for dysuria, flank pain, frequency, urgency and vaginal discharge.  Neurological: Negative for dizziness, weakness, light-headedness and headaches.   Physical Exam   Blood pressure 121/65, pulse 86, temperature 97.9 F (36.6 C), temperature source Oral, resp. rate 18, height 4\' 11"  (1.499 m), weight 77.9 kg, last menstrual period 03/06/2020, SpO2 100 %.  Patient Vitals for the past 24 hrs:  BP Temp Temp src Pulse  Resp SpO2 Height Weight  05/18/20 1356 121/65 97.9 F (36.6 C) Oral 86 18 100 % -- --  05/18/20 1349 -- -- -- -- -- -- 4\' 11"  (1.499 m) 77.9 kg   Physical Exam Vitals and nursing note reviewed.  Constitutional:      General: She is not in acute distress.    Appearance: Normal appearance. She is not ill-appearing, toxic-appearing or diaphoretic.  HENT:     Head: Normocephalic and atraumatic.  Pulmonary:     Effort: Pulmonary effort is normal.  Neurological:     Mental Status: She is alert and oriented to person, place, and time.  Psychiatric:        Mood and Affect: Mood normal.        Behavior: Behavior normal.        Thought Content: Thought content normal.         Judgment: Judgment normal.    Results for orders placed or performed during the hospital encounter of 05/18/20 (from the past 24 hour(s))  Urinalysis, Routine w reflex microscopic Urine, Clean Catch     Status: Abnormal   Collection Time: 05/18/20  2:03 PM  Result Value Ref Range   Color, Urine YELLOW YELLOW   APPearance CLEAR CLEAR   Specific Gravity, Urine >1.030 (H) 1.005 - 1.030   pH 5.5 5.0 - 8.0   Glucose, UA NEGATIVE NEGATIVE mg/dL   Hgb urine dipstick TRACE (A) NEGATIVE   Bilirubin Urine NEGATIVE NEGATIVE   Ketones, ur NEGATIVE NEGATIVE mg/dL   Protein, ur NEGATIVE NEGATIVE mg/dL   Nitrite NEGATIVE NEGATIVE   Leukocytes,Ua NEGATIVE NEGATIVE  Urinalysis, Microscopic (reflex)     Status: Abnormal   Collection Time: 05/18/20  2:03 PM  Result Value Ref Range   RBC / HPF NONE SEEN 0 - 5 RBC/hpf   WBC, UA 0-5 0 - 5 WBC/hpf   Bacteria, UA RARE (A) NONE SEEN   Squamous Epithelial / LPF 0-5 0 - 5  Pregnancy, urine POC     Status: Abnormal   Collection Time: 05/18/20  2:04 PM  Result Value Ref Range   Preg Test, Ur POSITIVE (A) NEGATIVE  Wet prep, genital     Status: Abnormal   Collection Time: 05/18/20  2:33 PM  Result Value Ref Range   Yeast Wet Prep HPF POC NONE SEEN NONE SEEN   Trich, Wet Prep NONE SEEN NONE SEEN   Clue Cells Wet Prep HPF POC NONE SEEN NONE SEEN   WBC, Wet Prep HPF POC MANY (A) NONE SEEN   Sperm NONE SEEN   CBC     Status: Abnormal   Collection Time: 05/18/20  2:36 PM  Result Value Ref Range   WBC 11.5 (H) 4.0 - 10.5 K/uL   RBC 4.01 3.87 - 5.11 MIL/uL   Hemoglobin 12.0 12.0 - 15.0 g/dL   HCT 16.137.0 36 - 46 %   MCV 92.3 80.0 - 100.0 fL   MCH 29.9 26.0 - 34.0 pg   MCHC 32.4 30.0 - 36.0 g/dL   RDW 09.612.6 04.511.5 - 40.915.5 %   Platelets 222 150 - 400 K/uL   nRBC 0.0 0.0 - 0.2 %  Comprehensive metabolic panel     Status: None   Collection Time: 05/18/20  2:36 PM  Result Value Ref Range   Sodium 137 135 - 145 mmol/L   Potassium 3.7 3.5 - 5.1 mmol/L    Chloride 104 98 - 111 mmol/L   CO2 27 22 - 32 mmol/L  Glucose, Bld 92 70 - 99 mg/dL   BUN 11 6 - 20 mg/dL   Creatinine, Ser 8.58 0.44 - 1.00 mg/dL   Calcium 9.4 8.9 - 85.0 mg/dL   Total Protein 7.7 6.5 - 8.1 g/dL   Albumin 3.9 3.5 - 5.0 g/dL   AST 25 15 - 41 U/L   ALT 25 0 - 44 U/L   Alkaline Phosphatase 40 38 - 126 U/L   Total Bilirubin 0.4 0.3 - 1.2 mg/dL   GFR, Estimated >27 >74 mL/min   Anion gap 6 5 - 15  hCG, quantitative, pregnancy     Status: Abnormal   Collection Time: 05/18/20  2:36 PM  Result Value Ref Range   hCG, Beta Chain, Quant, S 4,369 (H) <5 mIU/mL  ABO/Rh     Status: None   Collection Time: 05/18/20  2:36 PM  Result Value Ref Range   ABO/RH(D) B POS    No rh immune globuloin      NOT A RH IMMUNE GLOBULIN CANDIDATE, PT RH POSITIVE Performed at [facility], [address] [place], [place] [address]    US OB LESS THAN 14 WEEKS WITH OB TRANSVAGINAL  Result Date: 05/18/2020 CLINICAL DATA:  Vaginal bleeding for 2 days in first trimester of pregnancy, LMP 03/06/2020 EXAM: OBSTETRIC <14 WK [place] AND TRANSVAGINAL OB US TECHNIQUE: Both transabdominal and transvaginal ultrasound examinations were performed for complete evaluation of the gestation as well as the maternal uterus, adnexal regions, and pelvic cul-de-sac. Transvaginal technique was performed to assess early pregnancy. COMPARISON:  None FINDINGS: Intrauterine gestational sac: Present, single, slightly irregular Yolk sac:  Not identified Embryo:  Present Cardiac Activity: Not identified Heart Rate: N/A  bpm CRL:  3.9 mm   6 w   0 d                  [place] EDC: 01/11/2021 Subchorionic hemorrhage:  None visualized. Maternal uterus/adnexae: RIGHT ovary normal size and morphology, 2.1 x 2.4 x 1.8 cm. LEFT ovary normal size and morphology 1.4 x 2.2 x 1.7 cm. No free pelvic fluid or adnexal masses. IMPRESSION: Gestational sac seen within the uterus containing a tiny fetal pole 3.9 mm length but no yolk sac or fetal cardiac  activity are seen. Findings are suspicious but not yet definitive for failed pregnancy. Recommend follow-up US in 10-14 days for definitive diagnosis. This recommendation follows SRU consensus guidelines: Diagnostic Criteria for Nonviable Pregnancy Early in the First Trimester. Malva Limes Med 2013; 676:7209-47. Electronically Signed   By: Ulyses Southward M.D.   On: 05/18/2020 15:42    MAU Course  Procedures  MDM -r/o ectopic -UA: SG >1.030/trace hgb/rare bacteria, sending urine for culture -CBC: WNL -CMP: WNL -[place]: single IUP w/out FHB w/ slightly irregular sac, suspicious, but not yet definitive for failed pregnancy, [redacted]w[redacted]d -hCG: 4,369 -ABO: B Positive -WetPrep: WNL -GC/CT collected -pt discharged to home in stable condition  Orders Placed This Encounter  Procedures  . Wet prep, genital    Standing Status:   Standing    Number of Occurrences:   1  . Culture, OB Urine    Standing Status:   Standing    Number of Occurrences:   1  . US OB LESS THAN 14 WEEKS WITH OB TRANSVAGINAL    Standing Status:   Standing    Number of Occurrences:   1    Order Specific Question:   Symptom/Reason for Exam    Answer:   Vaginal  bleeding in pregnancy [[id number]]  . Urinalysis, Routine w reflex microscopic Urine, Clean Catch    Standing Status:   Standing    Number of Occurrences:   1  . CBC    Standing Status:   Standing    Number of Occurrences:   1  . Comprehensive metabolic panel    Standing Status:   Standing    Number of Occurrences:   1  . hCG, quantitative, pregnancy    Standing Status:   Standing    Number of Occurrences:   1  . Urinalysis, Microscopic (reflex)    Standing Status:   Standing    Number of Occurrences:   1  . Pregnancy, urine POC    Standing Status:   Standing    Number of Occurrences:   1  . ABO/Rh    Standing Status:   Standing    Number of Occurrences:   1  . Discharge patient    Order Specific Question:   Discharge disposition    Answer:   01-Home or Self Care [1]     Order Specific Question:   Discharge patient date    Answer:   05/18/2020    No orders of the defined types were placed in this encounter.  Assessment and Plan   1. Intrauterine pregnancy   2. Vaginal bleeding in pregnancy   3. [redacted] weeks gestation of pregnancy   4. Blood type, Rh positive     Allergies as of 05/18/2020   No Known Allergies     Medication List    STOP taking these medications   etonogestrel 68 MG Impl implant Commonly known as: NEXPLANON   naproxen 500 MG tablet Commonly known as: NAPROSYN     TAKE these medications   acetaminophen 500 MG tablet Commonly known as: TYLENOL Take 1,000 mg by mouth every 6 (six) hours as needed for headache (pain).   metroNIDAZOLE 500 MG tablet Commonly known as: FLAGYL Take 1 tablet (500 mg total) by mouth 2 (two) times daily.       -will call with culture results, if positive -message sent to change scheduled [place] to 10-14 days from now -reviewed expected s/sx of SAB -return MAU precautions given -pt discharged to home in stable condition  Joni Reining E Odessa Morren 05/18/2020, 4:27 PM

## 2020-05-18 NOTE — MAU Note (Signed)
Patient came out of her room stating she needed to leave now for work.  Printed AVS was given to patient and discussed her dc instructions.  Patient verbalized understanding and denied having any further questions.  Dc vital signs deferred since patient needing to leave right away.  Patient signed paper copy of AVS and dc'd home in good condition.

## 2020-05-18 NOTE — MAU Note (Signed)
Presents with c/o H/A, VB and abdominal cramping.  Reports VB began 2 days ago, states VB has varied from spotting to heavier bleeding with clots.  Reports changing sanitary napkin every 2 hours. LMP 03/06/2020

## 2020-05-19 ENCOUNTER — Inpatient Hospital Stay (HOSPITAL_COMMUNITY)
Admission: AD | Admit: 2020-05-19 | Discharge: 2020-05-19 | Disposition: A | Discharge status: Home / Self Care | Payer: PRIVATE HEALTH INSURANCE | Attending: Obstetrics and Gynecology | Admitting: Obstetrics and Gynecology

## 2020-05-19 ENCOUNTER — Inpatient Hospital Stay (HOSPITAL_COMMUNITY): Payer: PRIVATE HEALTH INSURANCE

## 2020-05-19 ENCOUNTER — Encounter (HOSPITAL_COMMUNITY): Payer: Self-pay | Admitting: Obstetrics and Gynecology

## 2020-05-19 ENCOUNTER — Other Ambulatory Visit: Payer: Self-pay

## 2020-05-19 DIAGNOSIS — O039 Complete or unspecified spontaneous abortion without complication: Secondary | ICD-10-CM | POA: Diagnosis present

## 2020-05-19 LAB — CULTURE, OB URINE: Culture: NO GROWTH

## 2020-05-19 LAB — GC/CHLAMYDIA PROBE AMP (~~LOC~~) NOT AT ARMC
Chlamydia: NEGATIVE
Comment: NEGATIVE
Comment: NORMAL
Neisseria Gonorrhea: NEGATIVE

## 2020-05-19 LAB — CBC
HCT: 38.3 % (ref 36.0–46.0)
Hemoglobin: 12.2 g/dL (ref 12.0–15.0)
MCH: 28.8 pg (ref 26.0–34.0)
MCHC: 31.9 g/dL (ref 30.0–36.0)
MCV: 90.5 fL (ref 80.0–100.0)
Platelets: 213 10*3/uL (ref 150–400)
RBC: 4.23 MIL/uL (ref 3.87–5.11)
RDW: 12.3 % (ref 11.5–15.5)
WBC: 16.5 10*3/uL — ABNORMAL HIGH (ref 4.0–10.5)
nRBC: 0 % (ref 0.0–0.2)

## 2020-05-19 LAB — HCG, QUANTITATIVE, PREGNANCY: hCG, Beta Chain, Quant, S: 2260 m[IU]/mL — ABNORMAL HIGH (ref <5)

## 2020-05-19 NOTE — MAU Provider Note (Signed)
History     CSN: [id number]  Arrival date and time: 05/19/20 1631   First Provider Initiated Contact with Patient 05/19/20 1713      Chief Complaint  Patient presents with  . Vaginal Bleeding  . Pelvic Pain   Sandra Herman is a 19 y.o. G1P0 at [redacted]w[redacted]d who presents to MAU via EMS with complaints of vaginal bleeding and abdominal cramping. Patient was seen yesterday for same complaints and given miscarriage precautions. Patient reports that vaginal bleeding started increasing last night then about 2 hours ago started soaking through her pants- patient has yellow sweatpants on chair that has 10cm area of blood on them. Patient reports lower abdominal cramping is associated with vaginal bleeding. Rates pain 9/10 - has not taken any medication for pain.    OB History    Gravida  1   Para      Term      Preterm      AB      Living        SAB      TAB      Ectopic      Multiple      Live Births              Past Medical History:  Diagnosis Date  . BV (bacterial vaginosis)   . Migraine     History reviewed. No pertinent surgical history.  History reviewed. No pertinent family history.  Social History   Tobacco Use  . Smoking status: Never Smoker  . Smokeless tobacco: Never Used  Vaping Use  . Vaping Use: Never used  Substance Use Topics  . Alcohol use: Not Currently  . Drug use: No    Allergies: No Known Allergies  Medications Prior to Admission  Medication Sig Dispense Refill Last Dose  . acetaminophen (TYLENOL) 500 MG tablet Take 1,000 mg by mouth every 6 (six) hours as needed for headache (pain).   Past Month at Unknown time  . metroNIDAZOLE (FLAGYL) 500 MG tablet Take 1 tablet (500 mg total) by mouth 2 (two) times daily. 20 tablet 0     Review of Systems  Constitutional: Negative.   Respiratory: Negative.   Cardiovascular: Negative.   Gastrointestinal: Positive for abdominal pain. Negative for constipation, diarrhea, nausea and vomiting.   Genitourinary: Positive for vaginal bleeding. Negative for difficulty urinating, dysuria, frequency, hematuria, pelvic pain and urgency.  Musculoskeletal: Negative.   Neurological: Negative.   Psychiatric/Behavioral: Negative.    Physical Exam   Blood pressure 131/70, pulse 73, temperature 98 F (36.7 C), temperature source Oral, resp. rate 18, last menstrual period 03/06/2020, SpO2 100 %.  Physical Exam Exam conducted with a chaperone present.  HENT:     Head: Normocephalic.  Cardiovascular:     Rate and Rhythm: Normal rate and regular rhythm.  Pulmonary:     Effort: Pulmonary effort is normal. No respiratory distress.     Breath sounds: Normal breath sounds. No wheezing.  Abdominal:     General: There is no distension.     Palpations: Abdomen is soft. There is no mass.     Tenderness: There is abdominal tenderness. There is no guarding.     Comments: Tenderness with palpation across lower abdomen, not specific to one side   Genitourinary:    Exam position: Lithotomy position.     Comments: Pelvic exam: Upon patient being placed in lithotomy position, GS seen at vaginal introitus- removed gently and intact with sponge stick and placed in speculum  container. Speculum then placed and cervix visualized, pink, visually dilated 2cm, without lesion, small amount of dark vaginal bleeding without clots (2 faux swabs), vaginal walls and external genitalia normal Skin:    General: Skin is warm and dry.  Neurological:     Mental Status: She is alert and oriented to person, place, and time.  Psychiatric:        Mood and Affect: Mood normal.        Behavior: Behavior normal.        Thought Content: Thought content normal.    MAU Course  Procedures  MDM Orders Placed This Encounter  Procedures  . US OB Transvaginal  . Urinalysis, Routine w reflex microscopic Urine, Clean Catch  . CBC  . hCG, quantitative, pregnancy   Products of conception collected on pelvic examination sent to  pathology   Results reviewed:  Results for orders placed or performed during the hospital encounter of 05/19/20 (from the past 24 hour(s))  CBC     Status: Abnormal   Collection Time: 05/19/20  5:49 PM  Result Value Ref Range   WBC 16.5 (H) 4.0 - 10.5 K/uL   RBC 4.23 3.87 - 5.11 MIL/uL   Hemoglobin 12.2 12.0 - 15.0 g/dL   HCT 69.4 36 - 46 %   MCV 90.5 80.0 - 100.0 fL   MCH 28.8 26.0 - 34.0 pg   MCHC 31.9 30.0 - 36.0 g/dL   RDW 85.4 62.7 - 03.5 %   Platelets 213 150 - 400 K/uL   nRBC 0.0 0.0 - 0.2 %  hCG, quantitative, pregnancy     Status: Abnormal   Collection Time: 05/19/20  5:49 PM  Result Value Ref Range   hCG, Beta Chain, Quant, S 2,260 (H) <5 mIU/mL   US OB Transvaginal  Result Date: 05/19/2020 CLINICAL DATA:  Vaginal bleeding during pregnancy, beta HCG 4,369 on 05/18/2020 EXAM: TRANSVAGINAL OB ULTRASOUND TECHNIQUE: Transvaginal ultrasound was performed for complete evaluation of the gestation as well as the maternal uterus, adnexal regions, and pelvic cul-de-sac. COMPARISON:  05/18/2020 FINDINGS: Intrauterine gestational sac: None Yolk sac:  Not Visualized. Embryo:  Not Visualized. Cardiac Activity: Not Visualized. Maternal uterus/adnexae: Heterogeneous thickening of the endometrium in the lower uterine segment measuring 15 mm consistent with interval spontaneous abortion. No internal color flow on Doppler interrogation. The gestational sac seen on yesterday's evaluation is no longer visualized. The right ovary measures 3.0 x 1.3 x 2.2 cm and the left ovary measures 2.4 x 1.7 x 2.3 cm. No adnexal masses. No free fluid. IMPRESSION: 1. No evidence of intrauterine pregnancy on this exam, compatible with interval spontaneous abortion. Irregular thickening of the endometrium likely reflects blood products related to miscarriage. Electronically Signed   By: Sharlet Salina M.D.   On: 05/19/2020 18:36   Discussed results of [place] with patient. Condolences given on SAB. Discussed follow up  appointment in 2 weeks with provider. Patient reports abdominal cramping has resolved and vaginal bleeding is scant.   Discussed reasons to return to MAU. Follow up as scheduled in the office. Return to MAU as needed. Pt stable at time of discharge.   Assessment and Plan   1. SAB (spontaneous abortion)    Discharge home Follow up as scheduled in the office for follow up  Return to MAU as needed for reasons discussed and/or emergencies    Follow-up Information    [facility] Follow up.   Specialty: Obstetrics and Gynecology Contact information: [address]  [address], [address] [place] [address] [phone]             Allergies as of 05/19/2020   No Known Allergies     Medication List    STOP taking these medications   metroNIDAZOLE 500 MG tablet Commonly known as: FLAGYL     TAKE these medications   acetaminophen 500 MG tablet Commonly known as: TYLENOL Take 1,000 mg by mouth every 6 (six) hours as needed for headache (pain).       Sharyon Cable CNM 05/19/2020, 7:34 PM

## 2020-05-19 NOTE — MAU Note (Signed)
Pt noticed vaginal bleeding beginning last night, beginning light pink in color and becoming heavier and darker red in color through this afternoon.  Pt c/o lower abdominal cramping beginning last night and diarrhea beginning this morning.  Pt reports new onset nausea and 1 large emesis around 15:15 this afternoon. Pt took liquid Tylenol this morning and this afternoon around 15:00. Pt reports dizziness when standing up.

## 2020-05-19 NOTE — Discharge Instructions (Signed)
Miscarriage A miscarriage is the loss of an unborn baby (fetus) before the 20th week of pregnancy. Follow these instructions at home: Medicines   Take over-the-counter and prescription medicines only as told by your doctor.  If you were prescribed antibiotic medicine, take it as told by your doctor. Do not stop taking the antibiotic even if you start to feel better.  Do not take NSAIDs unless your doctor says that this is safe for you. NSAIDs include aspirin and ibuprofen. These medicines can cause bleeding. Activity  Rest as directed. Ask your doctor what activities are safe for you.  Have someone help you at home during this time. General instructions  Write down how many pads you use each day and how soaked they are.  Watch the amount of tissue or clumps of blood (blood clots) that you pass from your vagina. Save any large amounts of tissue for your doctor.  Do not use tampons, douche, or have sex until your doctor approves.  To help you and your partner with the process of grieving, talk with your doctor or seek counseling.  When you are ready, meet with your doctor to talk about steps you should take for your health. Also, talk with your doctor about steps to take to have a healthy pregnancy in the future.  Keep all follow-up visits as told by your doctor. This is important. Contact a doctor if:  You have a fever or chills.  You have vaginal discharge that smells bad.  You have more bleeding. Get help right away if:  You have very bad cramps or pain in your back or belly.  You pass clumps of blood that are walnut-sized or larger from your vagina.  You pass tissue that is walnut-sized or larger from your vagina.  You soak more than 1 regular pad in an hour.  You get light-headed or weak.  You faint (pass out).  You have feelings of sadness that do not go away, or you have thoughts of hurting yourself. Summary  A miscarriage is the loss of an unborn baby before  the 20th week of pregnancy.  Follow your doctor's instructions for home care. Keep all follow-up appointments.  To help you and your partner with the process of grieving, talk with your doctor or seek counseling. This information is not intended to replace advice given to you by your health care provider. Make sure you discuss any questions you have with your health care provider. Document Revised: 10/25/2018 Document Reviewed: 08/08/2016 Elsevier Patient Education  2020 Elsevier Inc.  

## 2020-05-20 ENCOUNTER — Telehealth: Payer: Self-pay

## 2020-05-20 NOTE — Telephone Encounter (Signed)
Mchart message from pt requesting note for school for 05/19/20 Pt had SAB at hospital on 05/19/20 Pt has F/U scheduled on 06/03/20 Pt has never been seen in our office I consulted w/Dr.Constant ok to provide note for 05/19/20. Pt made aware .

## 2020-05-21 LAB — SURGICAL PATHOLOGY

## 2020-05-24 ENCOUNTER — Ambulatory Visit: Payer: PRIVATE HEALTH INSURANCE

## 2020-05-31 ENCOUNTER — Other Ambulatory Visit: Payer: PRIVATE HEALTH INSURANCE

## 2020-06-02 ENCOUNTER — Ambulatory Visit: Payer: PRIVATE HEALTH INSURANCE | Admitting: Certified Nurse Midwife

## 2020-11-07 ENCOUNTER — Inpatient Hospital Stay (HOSPITAL_COMMUNITY): Payer: 59

## 2020-11-07 ENCOUNTER — Inpatient Hospital Stay (HOSPITAL_COMMUNITY)
Admission: AD | Admit: 2020-11-07 | Discharge: 2020-11-07 | Disposition: A | Discharge status: Home / Self Care | Payer: 59 | Attending: Obstetrics & Gynecology | Admitting: Obstetrics & Gynecology

## 2020-11-07 ENCOUNTER — Other Ambulatory Visit: Payer: Self-pay

## 2020-11-07 ENCOUNTER — Encounter (HOSPITAL_COMMUNITY): Payer: Self-pay | Admitting: Obstetrics & Gynecology

## 2020-11-07 DIAGNOSIS — O26891 Other specified pregnancy related conditions, first trimester: Secondary | ICD-10-CM

## 2020-11-07 DIAGNOSIS — Z3A01 Less than 8 weeks gestation of pregnancy: Secondary | ICD-10-CM | POA: Insufficient documentation

## 2020-11-07 DIAGNOSIS — O3680X0 Pregnancy with inconclusive fetal viability, not applicable or unspecified: Secondary | ICD-10-CM

## 2020-11-07 DIAGNOSIS — R109 Unspecified abdominal pain: Secondary | ICD-10-CM | POA: Diagnosis present

## 2020-11-07 LAB — URINALYSIS, ROUTINE W REFLEX MICROSCOPIC
Bilirubin Urine: NEGATIVE
Glucose, UA: NEGATIVE mg/dL
Hgb urine dipstick: NEGATIVE
Ketones, ur: NEGATIVE mg/dL
Nitrite: NEGATIVE
Protein, ur: NEGATIVE mg/dL
Specific Gravity, Urine: 1.021 (ref 1.005–1.030)
pH: 5 (ref 5.0–8.0)

## 2020-11-07 LAB — WET PREP, GENITAL
Sperm: NONE SEEN
Trich, Wet Prep: NONE SEEN
Yeast Wet Prep HPF POC: NONE SEEN

## 2020-11-07 LAB — CBC
HCT: 36.3 % (ref 36.0–46.0)
Hemoglobin: 11.8 g/dL — ABNORMAL LOW (ref 12.0–15.0)
MCH: 29 pg (ref 26.0–34.0)
MCHC: 32.5 g/dL (ref 30.0–36.0)
MCV: 89.2 fL (ref 80.0–100.0)
Platelets: 212 10*3/uL (ref 150–400)
RBC: 4.07 MIL/uL (ref 3.87–5.11)
RDW: 12.6 % (ref 11.5–15.5)
WBC: 7.7 10*3/uL (ref 4.0–10.5)
nRBC: 0 % (ref 0.0–0.2)

## 2020-11-07 LAB — BASIC METABOLIC PANEL
Anion gap: 7 (ref 5–15)
BUN: 9 mg/dL (ref 6–20)
CO2: 24 mmol/L (ref 22–32)
Calcium: 9.2 mg/dL (ref 8.9–10.3)
Chloride: 105 mmol/L (ref 98–111)
Creatinine, Ser: 0.68 mg/dL (ref 0.44–1.00)
GFR, Estimated: >60 mL/min (ref >60)
Glucose, Bld: 103 mg/dL — ABNORMAL HIGH (ref 70–99)
Potassium: 3.5 mmol/L (ref 3.5–5.1)
Sodium: 136 mmol/L (ref 135–145)

## 2020-11-07 LAB — POCT PREGNANCY, URINE: Preg Test, Ur: POSITIVE — AB

## 2020-11-07 LAB — HCG, QUANTITATIVE, PREGNANCY: hCG, Beta Chain, Quant, S: 149 m[IU]/mL — ABNORMAL HIGH (ref <5)

## 2020-11-07 NOTE — MAU Provider Note (Addendum)
Chief Complaint:  Possible Pregnancy and Abdominal Pain    HPI: Sandra Herman is a 20 y.o. G2P0010 at [redacted]w[redacted]d by LMP who presents to maternity admissions reporting sharp abdominal pain. Denies vaginal bleeding, leaking of fluid, decreased fetal movement, fever, falls, or recent illness.   Positive pregnancy test today at home when she was due for cycle today instead. LMP 10/11/2019, confident on this date and journals her cycle. Reports she was "late" her last two cycles by about 4 days. She is excited about this pregnancy.   She has a history of spontaneous miscarriage on 05/19/2020.   Location: bilateral lower abdomen  Quality: Sharp Severity: 8-9/10 in pain scale (currently tolerable and not too bothersome for her)  Duration: 5-10 minutes per episode  Timing: Comes and goes, started this morning but had a similar episode last week that resolved.   Modifying factors: Spontaneously resolves, no specific movements or food seem to change it.  Associated signs and symptoms: No nausea, vomiting, dysuria, fever, hematuria, vaginal bleeding, or back pain. Eating and drinking as normal. No injury or trauma to abdomen.   Some urinary frequency and urgency, unsure for how long. Reports history of BV, no change in her discharge recently. Denies vaginal itching/irritation. No concern for STDs.    Past Medical History:  Diagnosis Date   BV (bacterial vaginosis)    Migraine    OB History  Gravida Para Term Preterm AB Living  2       1    SAB IAB Ectopic Multiple Live Births  1            # Outcome Date GA Lbr Len/2nd Weight Sex Delivery Anes PTL Lv  2 Current           1 SAB            History reviewed. No pertinent surgical history. History reviewed. No pertinent family history. Social History   Tobacco Use   Smoking status: Never Smoker   Smokeless tobacco: Never Used  Vaping Use   Vaping Use: Never used  Substance Use Topics   Alcohol use: Not Currently   Drug use: No   No Known  Allergies No medications prior to admission.    I have reviewed patient's Past Medical Hx, Surgical Hx, Family Hx, Social Hx, medications and allergies.   ROS:  Review of Systems  Respiratory: Negative for shortness of breath.   Gastrointestinal: Positive for abdominal pain. Negative for abdominal distention, constipation, diarrhea, nausea and vomiting.  Genitourinary: Positive for frequency and urgency. Negative for dysuria, flank pain, hematuria, vaginal bleeding and vaginal discharge.  Musculoskeletal: Negative for back pain.  Neurological: Negative for dizziness, weakness and light-headedness.    Physical Exam   Patient Vitals for the past 24 hrs:  BP Temp Temp src Pulse Resp SpO2 Height Weight  11/07/20 1214 115/66 97.9 F (36.6 C) Oral 76 16 100 % -- --  11/07/20 1005 -- -- -- -- -- 97 % -- --  11/07/20 1004 (!) 114/57 98.3 F (36.8 C) Oral 81 16 96 % 4\' 11"  (1.499 m) 77.6 kg    Constitutional: Well-developed, well-nourished female in no acute distress.  Cardiovascular: normal rate & rhythm Respiratory: normal effort GI: Abd soft, non-tender MS: Extremities nontender, no edema, normal ROM Neurologic: Alert and oriented x 4.  GU: no CVA tenderness  Pelvic: NEFG, white thin discharge, no blood, cervix clean. No cervical motion tenderness.    Fetal Tracing: N/A  Labs: Results for orders placed  or performed during the hospital encounter of 11/07/20 (from the past 24 hour(s))  Urinalysis, Routine w reflex microscopic Urine, Clean Catch     Status: Abnormal   Collection Time: 11/07/20  9:57 AM  Result Value Ref Range   Color, Urine YELLOW YELLOW   APPearance CLOUDY (A) CLEAR   Specific Gravity, Urine 1.021 1.005 - 1.030   pH 5.0 5.0 - 8.0   Glucose, UA NEGATIVE NEGATIVE mg/dL   Hgb urine dipstick NEGATIVE NEGATIVE   Bilirubin Urine NEGATIVE NEGATIVE   Ketones, ur NEGATIVE NEGATIVE mg/dL   Protein, ur NEGATIVE NEGATIVE mg/dL   Nitrite NEGATIVE NEGATIVE    Leukocytes,Ua LARGE (A) NEGATIVE   RBC / HPF 6-10 0 - 5 RBC/hpf   WBC, UA 0-5 0 - 5 WBC/hpf   Bacteria, UA RARE (A) NONE SEEN   Squamous Epithelial / LPF 21-50 0 - 5   Mucus PRESENT   Pregnancy, urine POC     Status: Abnormal   Collection Time: 11/07/20 10:00 AM  Result Value Ref Range   Preg Test, Ur POSITIVE (A) NEGATIVE  CBC     Status: Abnormal   Collection Time: 11/07/20 10:16 AM  Result Value Ref Range   WBC 7.7 4.0 - 10.5 K/uL   RBC 4.07 3.87 - 5.11 MIL/uL   Hemoglobin 11.8 (L) 12.0 - 15.0 g/dL   HCT 41.7 40.8 - 14.4 %   MCV 89.2 80.0 - 100.0 fL   MCH 29.0 26.0 - 34.0 pg   MCHC 32.5 30.0 - 36.0 g/dL   RDW 81.8 56.3 - 14.9 %   Platelets 212 150 - 400 K/uL   nRBC 0.0 0.0 - 0.2 %  Basic metabolic panel     Status: Abnormal   Collection Time: 11/07/20 10:16 AM  Result Value Ref Range   Sodium 136 135 - 145 mmol/L   Potassium 3.5 3.5 - 5.1 mmol/L   Chloride 105 98 - 111 mmol/L   CO2 24 22 - 32 mmol/L   Glucose, Bld 103 (H) 70 - 99 mg/dL   BUN 9 6 - 20 mg/dL   Creatinine, Ser 7.02 0.44 - 1.00 mg/dL   Calcium 9.2 8.9 - 63.7 mg/dL   GFR, Estimated >85 >88 mL/min   Anion gap 7 5 - 15  hCG, quantitative, pregnancy     Status: Abnormal   Collection Time: 11/07/20 10:16 AM  Result Value Ref Range   hCG, Beta Chain, Quant, S 149 (H) <5 mIU/mL  Wet prep, genital     Status: Abnormal   Collection Time: 11/07/20 10:48 AM   Specimen: Cervix  Result Value Ref Range   Yeast Wet Prep HPF POC NONE SEEN NONE SEEN   Trich, Wet Prep NONE SEEN NONE SEEN   Clue Cells Wet Prep HPF POC PRESENT (A) NONE SEEN   WBC, Wet Prep HPF POC MANY (A) NONE SEEN   Sperm NONE SEEN     Imaging:  US OB LESS THAN 14 WEEKS WITH OB TRANSVAGINAL  Result Date: 11/07/2020 CLINICAL DATA:  Abdominal pain in early pregnancy. Clinical gestational age of [redacted] weeks and 0 days. EXAM: OBSTETRIC <14 WK [place] AND TRANSVAGINAL OB US TECHNIQUE: Both transabdominal and transvaginal ultrasound examinations were performed  for complete evaluation of the gestation as well as the maternal uterus, adnexal regions, and pelvic cul-de-sac. Transvaginal technique was performed to assess early pregnancy. COMPARISON:  None. FINDINGS: Intrauterine gestational sac: Single Yolk sac:  Not visualized Embryo:  Not visualized MSD: 2.5 mm mm  4 w   6 d Maternal uterus/adnexae: Subchorionic hemorrhage: None Right ovary: Normal Left ovary: Normal Other :None Free fluid:  None IMPRESSION: 1. Probable early intrauterine gestational sac, but no yolk sac, fetal pole, or cardiac activity yet visualized. Recommend follow-up quantitative B-HCG levels and follow-up US in 14 days to confirm and assess viability. This recommendation follows SRU consensus guidelines: Diagnostic Criteria for Nonviable Pregnancy Early in the First Trimester. Malva Limes Med 2013; 476:5465-03. Electronically Signed   By: Signa Kell M.D.   On: 11/07/2020 11:38    MAU Course: Orders Placed This Encounter  Procedures   Wet prep, genital   OB Urine Culture   US OB LESS THAN 14 WEEKS WITH OB TRANSVAGINAL   Urinalysis, Routine w reflex microscopic Urine, Clean Catch   CBC   Basic metabolic panel   hCG, quantitative, pregnancy   Pregnancy, urine POC   Discharge patient Discharge disposition: 01-Home or Self Care; Discharge patient date: 11/07/2020   No orders of the defined types were placed in this encounter.   MDM:  20 year old female G2P0010 at [redacted]w[redacted]d via LMP, with recent spontaneous miscarriage at 6 weeks in 05/2020, presenting for evaluation of new onset intermittent sharp lower abdomen pain without vaginal bleeding. Pregnancy of unknown location, will proceed with evaluation including B-hcg, CBC, BMP, wet prep, gc/ch, U/A, and transvaginal U/S.   12:15pm: Reassessed, no current abdominal pain.   Assessment/Plan: 1. Pregnancy of unknown anatomic location   2. Abdominal pain in pregnancy, first trimester   3. Abdominal pain during pregnancy in first trimester     Labs and U/S findings discussed with patient and partner at bedside. U/S with intrauterine gestational sac, no yolk sac or fetal pole seen. B-Hcg 149. Discussed indeterminate findings and necessity for trending B-Hcg in 48 hours. U/A with large leukocytes but rare bacteria with vague urinary symptoms/no dysuria. Will send for culture and treat accordingly. Wet prep with clue cells, however patient is asymptomatic, will hold off on treatment for now.    Plan: Appt 4/26 at 2:00pm at [facility] for repeat B-Hcg F/u Gc/ch and urine culture  MAU precautions discussed   Discharge home in stable condition.     Follow-up Information     [facility] Go on 11/09/2020.   Specialty: Obstetrics and Gynecology Why: at 2pm for a lab visit  Contact information: [address] [place] [address] [phone]                Allergies as of 11/07/2020   No Known Allergies      Medication List     TAKE these medications    acetaminophen 500 MG tablet Commonly known as: TYLENOL Take 1,000 mg by mouth every 6 (six) hours as needed for headache (pain).        Leticia Penna, DO  Family Medicine PGY-3    Attestation of Supervision of Student:  I confirm that I have verified the information documented in the  resident's  note and that I have also personally coordinated and reperformed the history, physical exam and all medical decision making activities.  I have verified that all services and findings are accurately documented in this student's note; and I agree with management and plan as outlined in the documentation. I have also made any necessary editorial changes.   Calvert Cantor, CNM [facility] [employer] 11/07/2020 2:04 PM

## 2020-11-07 NOTE — Discharge Instructions (Signed)
  It was wonderful to see you today.  We have sent your urine for culture which should be back in the next 1-2 days.  Please make sure you make it to the scheduled appointment for the repeated beta-hCG.  If you have worsening severe abdominal pain, vaginal bleeding, or any concerns please return MAU.  Please make sure you drink plenty of fluids, you can take Tylenol as needed.

## 2020-11-07 NOTE — MAU Note (Signed)
Pt reports to mau with c/o pos hpt, with sharp lower abd pain that comes and goes.  Denies vag bleeding at this time.

## 2020-11-08 LAB — CULTURE, OB URINE: Culture: <10000 — AB

## 2020-11-08 LAB — GC/CHLAMYDIA PROBE AMP (~~LOC~~) NOT AT ARMC
Chlamydia: NEGATIVE
Comment: NEGATIVE
Comment: NORMAL
Neisseria Gonorrhea: NEGATIVE

## 2020-11-09 ENCOUNTER — Ambulatory Visit (INDEPENDENT_AMBULATORY_CARE_PROVIDER_SITE_OTHER): Payer: PRIVATE HEALTH INSURANCE

## 2020-11-09 ENCOUNTER — Other Ambulatory Visit: Payer: Self-pay

## 2020-11-09 VITALS — BP 120/62 | HR 87 | Wt 170.3 lb

## 2020-11-09 DIAGNOSIS — O3680X0 Pregnancy with inconclusive fetal viability, not applicable or unspecified: Secondary | ICD-10-CM | POA: Diagnosis not present

## 2020-11-09 LAB — BETA HCG QUANT (REF LAB): hCG Quant: 230 m[IU]/mL

## 2020-11-09 NOTE — Progress Notes (Signed)
Pt here today for STAT Beta for pregnancy of unknown location.  Pt denies vaginal bleeding.  Menstral like cramping in lower abd.  Medications reviewed.  Pt advised to stop taking BC powder and ibuprofen but can continue to take Tylenol for pain.  Pt explained that it would take approximately two hours for results to see if her levels rise appropriately and I would call her with results and f/u.  Pt verbalized understanding.    Received notification from [facility] that pt's beta results are 230.  Reviewed results with Dr. Crissie Reese who agreed that pt should have an U/S scheduled in 10-14 days for viability and come in two days, 11/11/20, for a rpt non stat beta.  U/S scheduled for  May 18th @ 0800.  Pt informed of the U/S appt and that if she starts bleeding like a period or her pain becomes one-sided and/or intensifies to please go to MAU.  Pt scheduled for 11/11/20 @ 0930.  Pt verbalized understanding.    Leonette Nutting  11/09/20

## 2020-11-10 NOTE — Progress Notes (Signed)
Chart reviewed for nurse visit. Agree with plan of care.   Venora Maples, MD 11/10/20 2:44 PM

## 2020-11-11 ENCOUNTER — Other Ambulatory Visit: Payer: PRIVATE HEALTH INSURANCE

## 2020-11-11 ENCOUNTER — Other Ambulatory Visit: Payer: Self-pay

## 2020-11-11 DIAGNOSIS — O3680X0 Pregnancy with inconclusive fetal viability, not applicable or unspecified: Secondary | ICD-10-CM

## 2020-11-12 LAB — BETA HCG QUANT (REF LAB): hCG Quant: 383 m[IU]/mL

## 2020-11-18 ENCOUNTER — Inpatient Hospital Stay (HOSPITAL_COMMUNITY): Payer: 59

## 2020-11-18 ENCOUNTER — Other Ambulatory Visit: Payer: Self-pay

## 2020-11-18 ENCOUNTER — Inpatient Hospital Stay (HOSPITAL_COMMUNITY)
Admission: AD | Admit: 2020-11-18 | Discharge: 2020-11-18 | Disposition: A | Discharge status: Home / Self Care | Payer: 59 | Attending: Family Medicine | Admitting: Family Medicine

## 2020-11-18 ENCOUNTER — Other Ambulatory Visit: Payer: Self-pay | Admitting: *Deleted

## 2020-11-18 ENCOUNTER — Encounter (HOSPITAL_COMMUNITY): Payer: Self-pay | Admitting: Obstetrics & Gynecology

## 2020-11-18 DIAGNOSIS — O209 Hemorrhage in early pregnancy, unspecified: Secondary | ICD-10-CM | POA: Diagnosis present

## 2020-11-18 DIAGNOSIS — Z3A01 Less than 8 weeks gestation of pregnancy: Secondary | ICD-10-CM | POA: Diagnosis not present

## 2020-11-18 DIAGNOSIS — O039 Complete or unspecified spontaneous abortion without complication: Secondary | ICD-10-CM | POA: Diagnosis not present

## 2020-11-18 DIAGNOSIS — O3680X0 Pregnancy with inconclusive fetal viability, not applicable or unspecified: Secondary | ICD-10-CM

## 2020-11-18 LAB — URINALYSIS, ROUTINE W REFLEX MICROSCOPIC
Bacteria, UA: NONE SEEN
Bilirubin Urine: NEGATIVE
Glucose, UA: NEGATIVE mg/dL
Ketones, ur: NEGATIVE mg/dL
Leukocytes,Ua: NEGATIVE
Nitrite: NEGATIVE
Protein, ur: 30 mg/dL — AB
RBC / HPF: >50 RBC/hpf — ABNORMAL HIGH (ref 0–5)
Specific Gravity, Urine: 1.028 (ref 1.005–1.030)
pH: 5 (ref 5.0–8.0)

## 2020-11-18 LAB — CBC
HCT: 35.2 % — ABNORMAL LOW (ref 36.0–46.0)
Hemoglobin: 11.3 g/dL — ABNORMAL LOW (ref 12.0–15.0)
MCH: 29 pg (ref 26.0–34.0)
MCHC: 32.1 g/dL (ref 30.0–36.0)
MCV: 90.5 fL (ref 80.0–100.0)
Platelets: 197 10*3/uL (ref 150–400)
RBC: 3.89 MIL/uL (ref 3.87–5.11)
RDW: 12.6 % (ref 11.5–15.5)
WBC: 7.2 10*3/uL (ref 4.0–10.5)
nRBC: 0 % (ref 0.0–0.2)

## 2020-11-18 LAB — COMPREHENSIVE METABOLIC PANEL
ALT: 12 U/L (ref 0–44)
AST: 19 U/L (ref 15–41)
Albumin: 3.5 g/dL (ref 3.5–5.0)
Alkaline Phosphatase: 40 U/L (ref 38–126)
Anion gap: 5 (ref 5–15)
BUN: 11 mg/dL (ref 6–20)
CO2: 24 mmol/L (ref 22–32)
Calcium: 8.8 mg/dL — ABNORMAL LOW (ref 8.9–10.3)
Chloride: 106 mmol/L (ref 98–111)
Creatinine, Ser: 0.66 mg/dL (ref 0.44–1.00)
GFR, Estimated: >60 mL/min (ref >60)
Glucose, Bld: 88 mg/dL (ref 70–99)
Potassium: 3.8 mmol/L (ref 3.5–5.1)
Sodium: 135 mmol/L (ref 135–145)
Total Bilirubin: 0.5 mg/dL (ref 0.3–1.2)
Total Protein: 7 g/dL (ref 6.5–8.1)

## 2020-11-18 LAB — HCG, QUANTITATIVE, PREGNANCY: hCG, Beta Chain, Quant, S: 20 m[IU]/mL — ABNORMAL HIGH (ref <5)

## 2020-11-18 NOTE — Discharge Instructions (Signed)
Miscarriage A miscarriage is the loss of a pregnancy before the 20th week of pregnancy. Sometimes, a pregnancy ends before a woman knows that she is pregnant. If you lose a pregnancy, talk with your doctor about:  Questions you have about the loss of your baby.  How to work through your grief.  Plans for future pregnancy. What are the causes? Many times, the cause of this condition is not known. What increases the risk? These things may make a pregnant woman more likely to lose a pregnancy: Certain health conditions  Conditions that affect hormones, such as: ? Thyroid disease. ? Polycystic ovary syndrome.  Diabetes.  A disease that causes the body's disease-fighting system to attack itself by mistake.  Infections.  Bleeding problems.  Being very overweight. Lifestyle factors  Using products that have tobacco or nicotine in them.  Being around tobacco smoke.  Having alcohol.  Having a lot of caffeine.  Using drugs. Problems with reproductive organs or parts  Having a cervix that opens and thins before your due date. The cervix is the lowest part of your womb.  Having Asherman syndrome, which leads to: ? Scars in the womb. ? The womb being abnormal in shape.  Growths (fibroids) in the womb.  Problems in the body that are present at birth.  Infection of the cervix or womb. Personal or health history  Injury.  Having lost a pregnancy before.  Being younger than age 18 or older than age 35.  Being around a harmful substance, such as radiation.  Having lead or other heavy metals in: ? Things you eat or drink. ? The air around you.  Using certain medicines. What are the signs or symptoms?  Blood or spots of blood coming from the vagina. You may also have cramps or pain.  Pain or cramps in the belly or low back.  Fluid or tissue coming out of the vagina. How is this treated? Sometimes, treatment is not needed. If you need treatment, you may be  treated with:  A procedure to open the cervix more and take tissue out of the womb.  Medicines. You may get a shot of medicine called Rho(D) immune globulin. Follow these instructions at home: Medicines  Take over-the-counter and prescription medicines only as told by your doctor.  If you were prescribed antibiotic medicine, take it as told by your doctor. Do not stop taking it even if you start to feel better. Activity  Rest as told by your doctor. Ask your doctor what activities are safe for you.  Have someone help you at home during this time. General instructions  Watch how much tissue comes out of the vagina.  Watch the size of any blood clots that come out of the vagina.  Do not have sex or douche until your doctor says it is okay.  Do not put things, such as tampons, in your vagina until your doctor says it is okay.  To help you and your partner with grieving: ? Talk with your doctor. ? See a counselor.  When you are ready, talk with your doctor about: ? Things to do for your health. ? How you can be healthy if you get pregnant again.  Keep all follow-up visits.   Where to find more information  The American College of Obstetricians and Gynecologists: acog.org  [place] Department of Health and Human Services Office of Women's Health: hrsa.gov/office-womens-health Contact a doctor if:  You have a fever or chills.  There is bad-smelling fluid coming   from the vagina.  You have more bleeding.  Tissue or clots of blood come out of your vagina. Get help right away if:  You have very bad cramps or pain in your back or belly.  You soak more than 2 large pads in an hour for more than 2 hours.  You get light-headed or weak.  You faint.  You feel sad, and you have sad thoughts a lot of the time.  You think about hurting yourself. Get help right awayif you feel like you may hurt yourself or others, or have thoughts about taking your own life. Go to your nearest  emergency room or:  Call your local emergency services (911 in the [place]  Call the National Suicide Prevention Lifeline at 1-800-273-8255. This is open 24 hours a day.  Text the Crisis Text Line at 741741. Summary  A miscarriage is the loss of a pregnancy before the 20th week of pregnancy. Sometimes, a pregnancy ends before a woman knows that she is pregnant.  Follow instructions from your doctor about medicines and activity.  To help you and your partner with grieving, talk with your doctor or a counselor.  Keep all follow-up visits. This information is not intended to replace advice given to you by your health care provider. Make sure you discuss any questions you have with your health care provider. Document Revised: 01/02/2020 Document Reviewed: 01/02/2020 Elsevier Patient Education  2021 Elsevier Inc.  

## 2020-11-18 NOTE — MAU Note (Addendum)
Presents with c/o heavy VB and passing blood clots that began 2 days ago.  Reports saturated sanitary napkin & panties between 0500 to 0700 this morning.Also reports having abdominal pain, abdominal pain has been occurring x2 weeks.  Hx of previous miscarriage in November 2021.

## 2020-11-18 NOTE — MAU Provider Note (Signed)
History     CSN: [id number]  Arrival date and time: 11/18/20 0931   Event Date/Time   First Provider Initiated Contact with Patient 11/18/20 1011     Chief Complaint  Patient presents with  . Vaginal Bleeding   HPI  Sandra Herman is a 20 y.o. G2P0010 at [redacted]w[redacted]d with PMH of SAB presented for painful vaginal bleeding. She notes the vaginal bleeding began as light pink with no clots and progressively has gotten worse. Last night she notes she soaked through a pad and onto her bed sheets in 2 hours with several small clots. She has cramping that she describes as dull cramping pain that began about 2 weeks ago. The cramping worsened about 2 days ago when the bleeding began. Today she rates the cramping as a 4/10, with the worst being a 7/10. She has taken Tylenol today and says the pain is manageable. She has not had intercourse since the day of conception. She recently stopped taking sertraline 50 mg at request of her PCP.   She was seen in the MAU on 11/07/20 for lower abdominal pain. U/S at the time showed intrauterine gestational sac with no yolk sac present. Her hCG at the time was 149. Follow up HCGs done on 4/26 was 230 and on 4/28 was 383.   She denies vaginal discharge, gush of fluid, urinary sx, CP, SOB, and dizziness.    OB History    Gravida  2   Para      Term      Preterm      AB  1   Living        SAB  1   IAB      Ectopic      Multiple      Live Births             Results for orders placed or performed during the hospital encounter of 11/18/20 (from the past 24 hour(s))  Urinalysis, Routine w reflex microscopic Urine, Clean Catch     Status: Abnormal   Collection Time: 11/18/20  9:56 AM  Result Value Ref Range   Color, Urine YELLOW YELLOW   APPearance HAZY (A) CLEAR   Specific Gravity, Urine 1.028 1.005 - 1.030   pH 5.0 5.0 - 8.0   Glucose, UA NEGATIVE NEGATIVE mg/dL   Hgb urine dipstick LARGE (A) NEGATIVE   Bilirubin Urine NEGATIVE NEGATIVE    Ketones, ur NEGATIVE NEGATIVE mg/dL   Protein, ur 30 (A) NEGATIVE mg/dL   Nitrite NEGATIVE NEGATIVE   Leukocytes,Ua NEGATIVE NEGATIVE   RBC / HPF >50 (H) 0 - 5 RBC/hpf   WBC, UA 0-5 0 - 5 WBC/hpf   Bacteria, UA NONE SEEN NONE SEEN   Squamous Epithelial / LPF 0-5 0 - 5   Mucus PRESENT   CBC     Status: Abnormal   Collection Time: 11/18/20 10:06 AM  Result Value Ref Range   WBC 7.2 4.0 - 10.5 K/uL   RBC 3.89 3.87 - 5.11 MIL/uL   Hemoglobin 11.3 (L) 12.0 - 15.0 g/dL   HCT 32.9 (L) 51.8 - 84.1 %   MCV 90.5 80.0 - 100.0 fL   MCH 29.0 26.0 - 34.0 pg   MCHC 32.1 30.0 - 36.0 g/dL   RDW 66.0 63.0 - 16.0 %   Platelets 197 150 - 400 K/uL   nRBC 0.0 0.0 - 0.2 %  Comprehensive metabolic panel     Status: Abnormal   Collection Time: 11/18/20 10:06  AM  Result Value Ref Range   Sodium 135 135 - 145 mmol/L   Potassium 3.8 3.5 - 5.1 mmol/L   Chloride 106 98 - 111 mmol/L   CO2 24 22 - 32 mmol/L   Glucose, Bld 88 70 - 99 mg/dL   BUN 11 6 - 20 mg/dL   Creatinine, Ser 6.21 0.44 - 1.00 mg/dL   Calcium 8.8 (L) 8.9 - 10.3 mg/dL   Total Protein 7.0 6.5 - 8.1 g/dL   Albumin 3.5 3.5 - 5.0 g/dL   AST 19 15 - 41 U/L   ALT 12 0 - 44 U/L   Alkaline Phosphatase 40 38 - 126 U/L   Total Bilirubin 0.5 0.3 - 1.2 mg/dL   GFR, Estimated >30 >86 mL/min   Anion gap 5 5 - 15  hCG, quantitative, pregnancy     Status: Abnormal   Collection Time: 11/18/20 10:07 AM  Result Value Ref Range   hCG, Beta Chain, Quant, S 20 (H) <5 mIU/mL   US OB LESS THAN 14 WEEKS WITH OB TRANSVAGINAL  Result Date: 11/18/2020 CLINICAL DATA:  Pregnant patient.  Unknown dates. EXAM: OBSTETRIC <14 WK [place] AND TRANSVAGINAL OB US TECHNIQUE: Both transabdominal and transvaginal ultrasound examinations were performed for complete evaluation of the gestation as well as the maternal uterus, adnexal regions, and pelvic cul-de-sac. Transvaginal technique was performed to assess early pregnancy. COMPARISON:  None. FINDINGS: Intrauterine gestational  sac: None Yolk sac:  Not Visualized. Embryo:  Not Visualized. Cardiac Activity: Not Visualized. Maternal uterus/adnexae: No uterine masses. Prominent endometrium, 1.3 cm in thickness. No endometrial masses or fluid. Normal ovaries and adnexa. No abnormal pelvic free fluid. IMPRESSION: 1. Normal exam. No visualized intrauterine or ectopic pregnancy. Findings may reflect a very early pregnancy, too early to detect by sonography. Recommend follow-up beta HCG levels and, if rising, ultrasound in 10-14 days to assess for normal pregnancy progression. Electronically Signed   By: Amie Portland M.D.   On: 11/18/2020 11:41     Past Medical History:  Diagnosis Date  . BV (bacterial vaginosis)   . Migraine     Past Surgical History:  Procedure Laterality Date  . NO PAST SURGERIES      Family History  Problem Relation Age of Onset  . Healthy Mother   . Healthy Father   . Diabetes Maternal Grandmother   . Diabetes Maternal Grandfather     Social History   Tobacco Use  . Smoking status: Never Smoker  . Smokeless tobacco: Never Used  Vaping Use  . Vaping Use: Never used  Substance Use Topics  . Alcohol use: Not Currently  . Drug use: No    Allergies: No Known Allergies  Medications Prior to Admission  Medication Sig Dispense Refill Last Dose  . acetaminophen (TYLENOL) 500 MG tablet Take 1,000 mg by mouth every 6 (six) hours as needed for headache (pain).   11/18/2020 at Unknown time  . Prenatal Vit-Fe Fumarate-FA (PRENATAL MULTIVITAMIN) TABS tablet Take 1 tablet by mouth daily at 12 noon.   11/18/2020 at Unknown time    Review of Systems  Constitutional: Negative for chills, diaphoresis and fever.  Respiratory: Negative for cough, chest tightness and shortness of breath.   Cardiovascular: Negative for chest pain.  Gastrointestinal: Positive for abdominal pain. Negative for diarrhea, nausea and vomiting.  Genitourinary: Positive for vaginal bleeding. Negative for dysuria, frequency,  pelvic pain and vaginal discharge.  Neurological: Negative for dizziness, light-headedness and headaches.   Physical Exam   Blood  pressure 117/65, pulse 68, temperature 98.1 F (36.7 C), temperature source Oral, resp. rate 18, height 4\' 11"  (1.499 m), weight 78.4 kg, last menstrual period 10/10/2020, SpO2 97 %, unknown if currently breastfeeding.  Physical Exam Constitutional:      General: She is not in acute distress.    Appearance: Normal appearance.  HENT:     Head: Normocephalic and atraumatic.  Eyes:     Extraocular Movements: Extraocular movements intact.     Pupils: Pupils are equal, round, and reactive to light.  Cardiovascular:     Rate and Rhythm: Normal rate and regular rhythm.     Heart sounds: Normal heart sounds.  Pulmonary:     Effort: Pulmonary effort is normal.     Breath sounds: No stridor. No wheezing, rhonchi or rales.  Abdominal:     General: There is no distension.     Palpations: Abdomen is soft.     Tenderness: There is abdominal tenderness (lower abdominal). There is no guarding or rebound.  Musculoskeletal:     Cervical back: Normal range of motion.  Skin:    General: Skin is warm and dry.  Neurological:     Mental Status: She is alert and oriented to person, place, and time.  Psychiatric:        Mood and Affect: Mood normal.        Behavior: Behavior normal.     MAU Course  Procedures  MDM #rule out ectopic ordered: CBC, hCG quant, CMP, and transvaginal U/S ordered.    Patient agreeable with treatment plan.  Assessment and Plan  1. Pregnancy, location unknown - 10/12/2020 OB LESS THAN 14 WEEKS WITH OB TRANSVAGINAL U/S showed no IUP or ectopic pregnancy. HCG was 20. Likely active miscarriage.  2. Miscarriage Based on hCG decrease to 20 from 398. F/u hCG quant in the office in one week. CBC showed hgb decrease from 11.8 to 11.3 since 11 days ago. CMP unremarkable.   Patient was stable for discharge. Advised patient she could restart her  sertraline prescription and to follow up with her PCP.    [place] PA-S 11/18/2020, 12:11 PM

## 2020-11-22 ENCOUNTER — Other Ambulatory Visit: Payer: PRIVATE HEALTH INSURANCE

## 2020-12-01 ENCOUNTER — Inpatient Hospital Stay: Admission: RE | Admit: 2020-12-01 | Payer: PRIVATE HEALTH INSURANCE | Source: Ambulatory Visit

## 2021-07-17 ENCOUNTER — Inpatient Hospital Stay (HOSPITAL_COMMUNITY): Admit: 2021-07-17 | Payer: Self-pay

## 2021-11-23 ENCOUNTER — Emergency Department (HOSPITAL_COMMUNITY): Payer: 59

## 2021-11-23 ENCOUNTER — Encounter (HOSPITAL_COMMUNITY): Payer: Self-pay | Admitting: *Deleted

## 2021-11-23 ENCOUNTER — Emergency Department (HOSPITAL_COMMUNITY)
Admission: EM | Admit: 2021-11-23 | Discharge: 2021-11-23 | Disposition: A | Discharge status: Home / Self Care | Payer: 59 | Attending: Emergency Medicine | Admitting: Emergency Medicine

## 2021-11-23 ENCOUNTER — Other Ambulatory Visit: Payer: Self-pay

## 2021-11-23 DIAGNOSIS — W010XXA Fall on same level from slipping, tripping and stumbling without subsequent striking against object, initial encounter: Secondary | ICD-10-CM | POA: Diagnosis not present

## 2021-11-23 DIAGNOSIS — S99912A Unspecified injury of left ankle, initial encounter: Secondary | ICD-10-CM | POA: Diagnosis present

## 2021-11-23 DIAGNOSIS — S80211A Abrasion, right knee, initial encounter: Secondary | ICD-10-CM | POA: Insufficient documentation

## 2021-11-23 DIAGNOSIS — S93492A Sprain of other ligament of left ankle, initial encounter: Secondary | ICD-10-CM | POA: Insufficient documentation

## 2021-11-23 DIAGNOSIS — W19XXXA Unspecified fall, initial encounter: Secondary | ICD-10-CM

## 2021-11-23 MED ORDER — BACITRACIN ZINC 500 UNIT/GM EX OINT
TOPICAL_OINTMENT | CUTANEOUS | Status: AC
  Filled 2021-11-23: qty 0.9

## 2021-11-23 NOTE — ED Provider Notes (Signed)
?[facility]-EMERGENCY DEPT ?Provider Note ? ? ?CSN: [id number] ?Arrival date & time: 11/23/21  3903 ? ?  ? ?History ? ?Chief Complaint  ?Patient presents with  ? Fall  ? Ankle Pain  ? Knee Pain  ? ? ?Sandra Herman is a 21 y.o. female. ? ?HPI ?Patient presents 1 day after mechanical fall with pain in her right knee, left ankle.  She did not hit her head, neck, shoulders, has been ambulatory since the event.  She notes that she has had pain in her left ankle laterally, right anterior inferior knee since the fall, sore, she has not taken medication.  She sustained an abrasion which she put dressing on herself yesterday on the right knee. ?  ? ?Home Medications ?Prior to Admission medications   ?Medication Sig Start Date End Date Taking? Authorizing Provider  ?acetaminophen (TYLENOL) 500 MG tablet Take 1,000 mg by mouth every 6 (six) hours as needed for headache (pain).    [provider]  ?Prenatal Vit-Fe Fumarate-FA (PRENATAL MULTIVITAMIN) TABS tablet Take 1 tablet by mouth daily at 12 noon.    [provider]  ?   ? ?Allergies    ?Patient has no known allergies.   ? ?Review of Systems   ?Review of Systems  ?All other systems reviewed and are negative. ? ?Physical Exam ?Updated Vital Signs ?BP 121/69   Pulse 96   Resp 18   Ht 4\' 11"  (1.499 m)   Wt 77.1 kg   SpO2 96%   BMI 34.34 kg/m?  ?Physical Exam ?Vitals and nursing note reviewed.  ?Constitutional:   ?   General: She is not in acute distress. ?   Appearance: She is well-developed.  ?HENT:  ?   Head: Normocephalic and atraumatic.  ?Eyes:  ?   Conjunctiva/sclera: Conjunctivae normal.  ?Cardiovascular:  ?   Rate and Rhythm: Normal rate and regular rhythm.  ?   Pulses: Normal pulses.  ?Pulmonary:  ?   Effort: Pulmonary effort is normal. No respiratory distress.  ?   Breath sounds: No stridor.  ?Abdominal:  ?   General: There is no distension.  ?Musculoskeletal:  ?   Comments: Right knee essentially unremarkable other than  the road rash on the inferior aspect, range of motion normal. ?Left ankle with tenderness throughout the lateral malleolus.  Range of motion somewhat limited secondary to pain, but Achilles and dorsiflexion appropriate  ?Skin: ?   General: Skin is warm and dry.  ? ?    ?Neurological:  ?   Mental Status: She is alert and oriented to person, place, and time.  ?   Cranial Nerves: No cranial nerve deficit.  ?Psychiatric:     ?   Mood and Affect: Mood normal.  ? ? ?ED Results / Procedures / Treatments   ?Labs ?(all labs ordered are listed, but only abnormal results are displayed) ?Labs Reviewed - No data to display ? ?EKG ?None ? ?Radiology ?DG Ankle Complete Left ? ?Result Date: 11/23/2021 ?CLINICAL DATA:  Lateral left ankle pain after fall last night. EXAM: LEFT ANKLE COMPLETE - 3+ VIEW COMPARISON:  None Available. FINDINGS: Normal bone mineralization. The ankle mortise is symmetric and intact. Joint spaces are preserved. No acute fracture or dislocation. Mild lateral malleolar soft tissue swelling. IMPRESSION: Mild lateral malleolar soft tissue swelling.  No acute fracture. Electronically Signed   By: 01/23/2022 M.D.   On: 11/23/2021 09:42  ? ?DG Knee Complete 4 Views Right ? ?Result Date: 11/23/2021 ?CLINICAL  DATA:  Fall landing on anterior right knee. Generalized right knee pain. EXAM: RIGHT KNEE - COMPLETE 4+ VIEW COMPARISON:  None Available. FINDINGS: Normal bone mineralization. Joint spaces are preserved. No acute fracture is seen. No dislocation. No joint effusion. IMPRESSION: Normal right knee radiographs. Electronically Signed   By: Neita Garnet M.D.   On: 11/23/2021 09:41   ? ?Procedures ?Procedures  ? ? ?Medications Ordered in ED ?Medications  ?bacitracin 500 UNIT/GM ointment (has no administration in time range)  ? ? ?ED Course/ Medical Decision Making/ A&P ?Generally well adult female presents after a fall that occurred yesterday with pain in multiple areas, skin lesion.  Skin wound is partial-thickness,  was cleaned, redressed.  Tetanus is up-to-date.  Patient is distally neurovascularly intact, but has notable pain in the left lateral ankle.  I reviewed the x-rays, I demonstrated them to her at bedside, including concern for joint widening on the left lateral malleolus, suspicious for sprain.  Patient had immobilization, started anti-inflammatories was discharged to follow-up with orthopedics. ?Final Clinical Impression(s) / ED Diagnoses ?Final diagnoses:  ?Fall, initial encounter  ?Sprain of anterior talofibular ligament of left ankle, initial encounter  ?Abrasion of right knee, initial encounter  ? ? ?Rx / DC Orders ?ED Discharge Orders   ? ? None  ? ?  ? ? ?  ?Gerhard Munch, MD ?11/23/21 1008 ? ?

## 2021-11-23 NOTE — Discharge Instructions (Signed)
Please continue using ice packs, ibuprofen and Tylenol for pain relief.  Your x-rays today were reassuring, there is no evidence for a fracture.  Follow-up with our orthopedic colleagues as needed. ?

## 2021-11-23 NOTE — ED Triage Notes (Signed)
Fall last night, rt knee pain and left ankle pain, tripped and fell. ?

## 2021-11-23 NOTE — ED Notes (Signed)
Wound care to abrasion on rt knee then bandage applied. Ice pack provided. ?

## 2024-03-22 IMAGING — CR DG KNEE COMPLETE 4+V*R*
4 series · 4 of 4 positions shown · non-contrast
Comparison: None Available.

CLINICAL DATA: Fall landing on anterior right knee. Generalized
right knee pain.

EXAM:
RIGHT KNEE - COMPLETE 4+ VIEW

[t knee ap right]
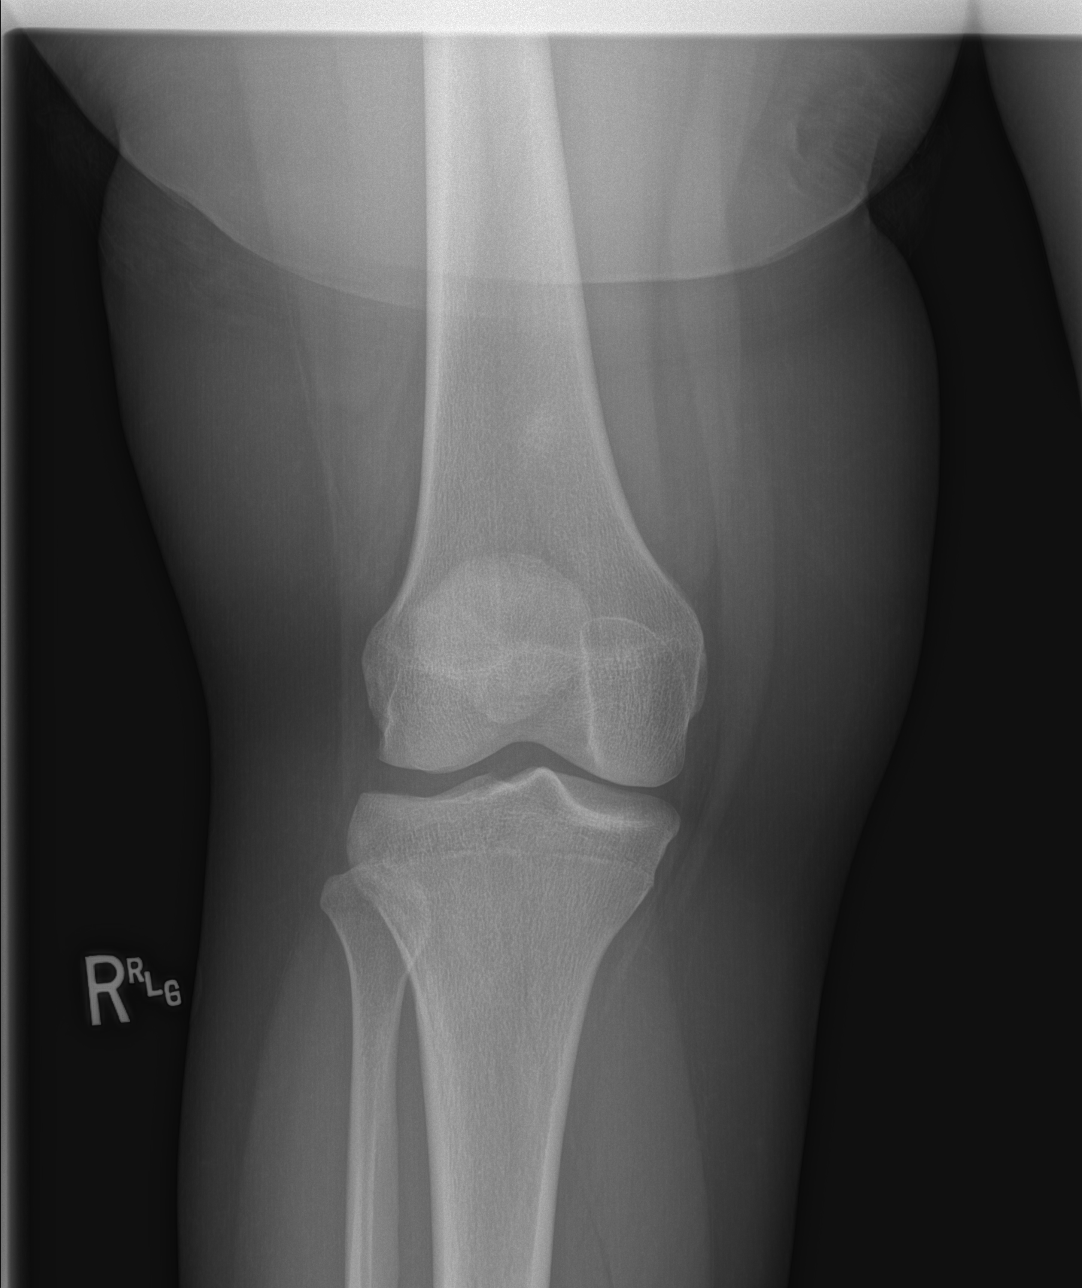

[t knee obl right (1 of 2)]
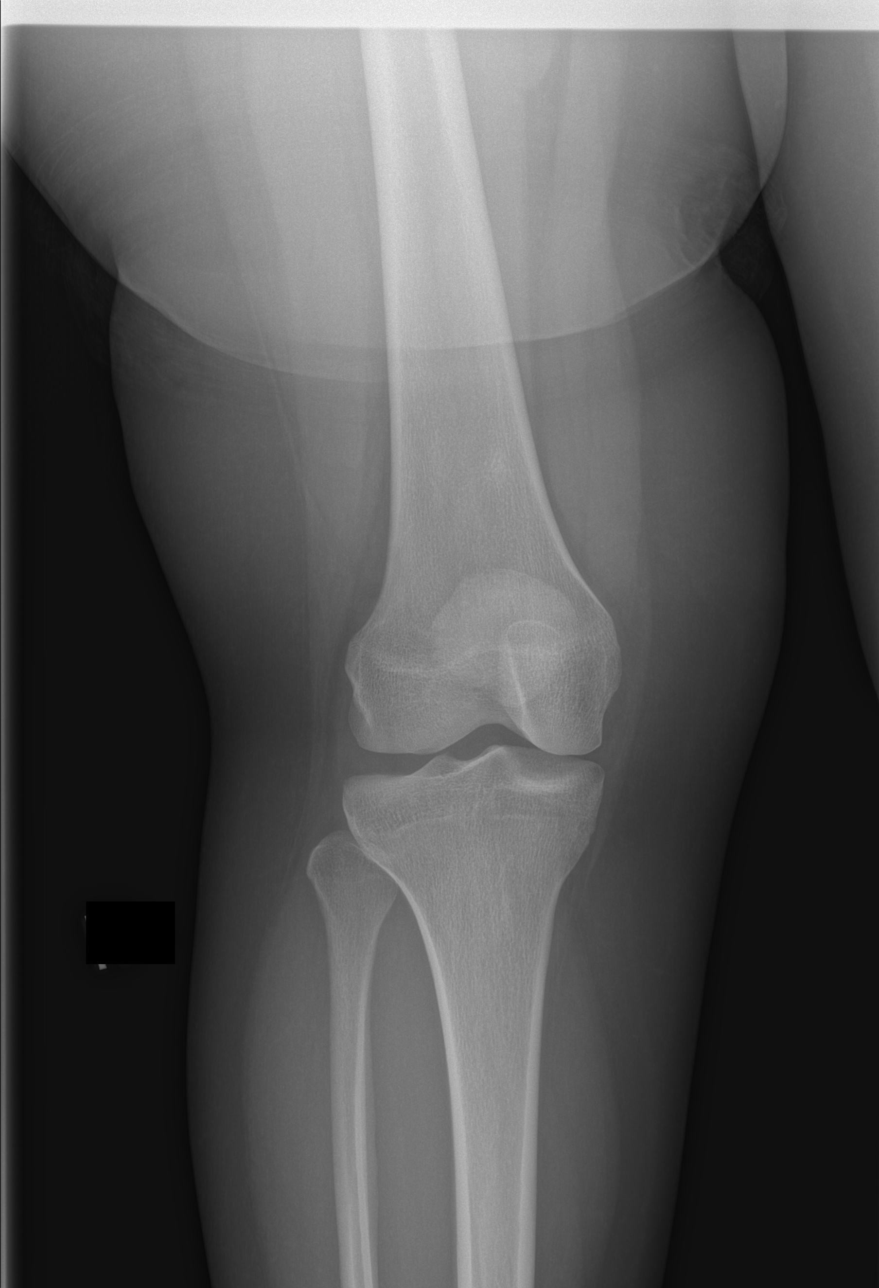

[t knee obl right (2 of 2)]
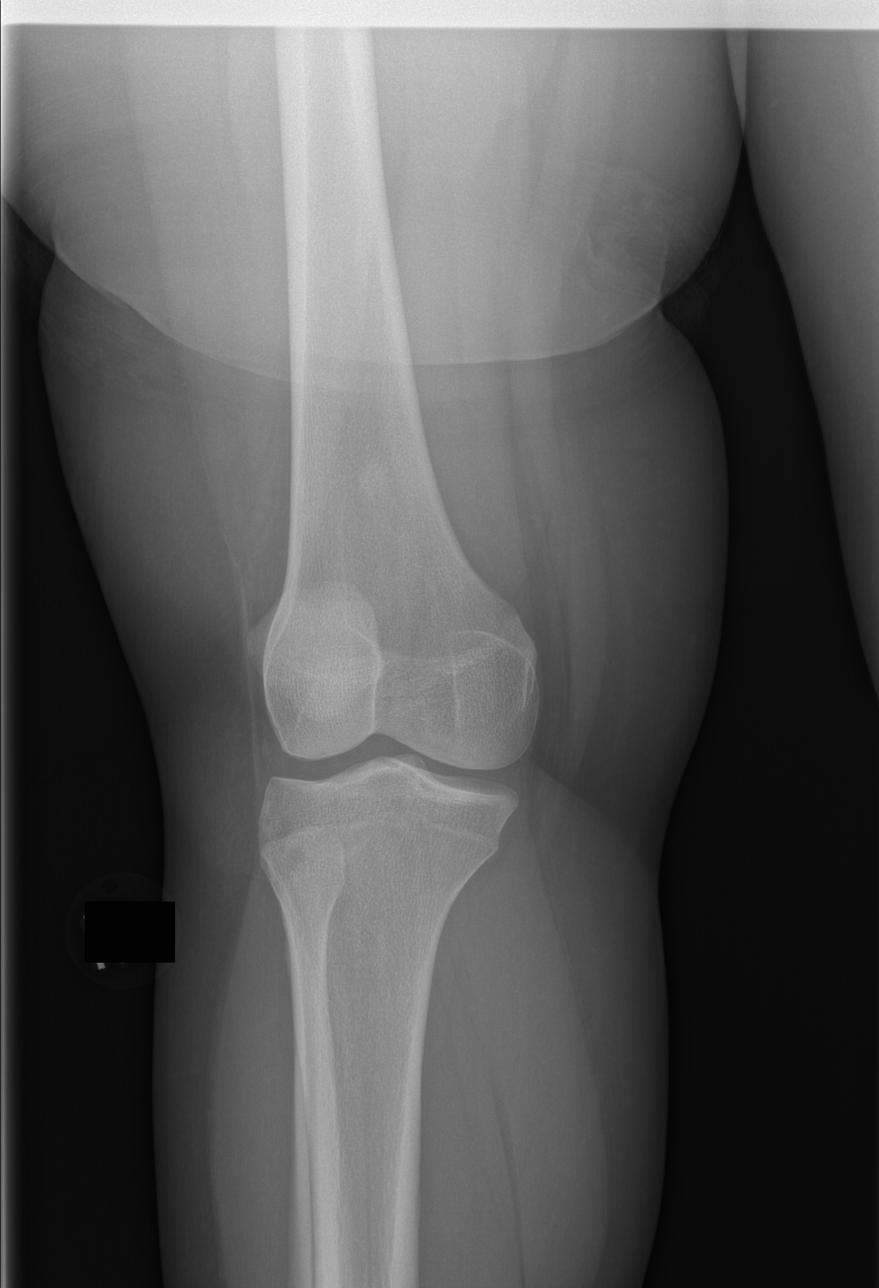

[t knee lat right]
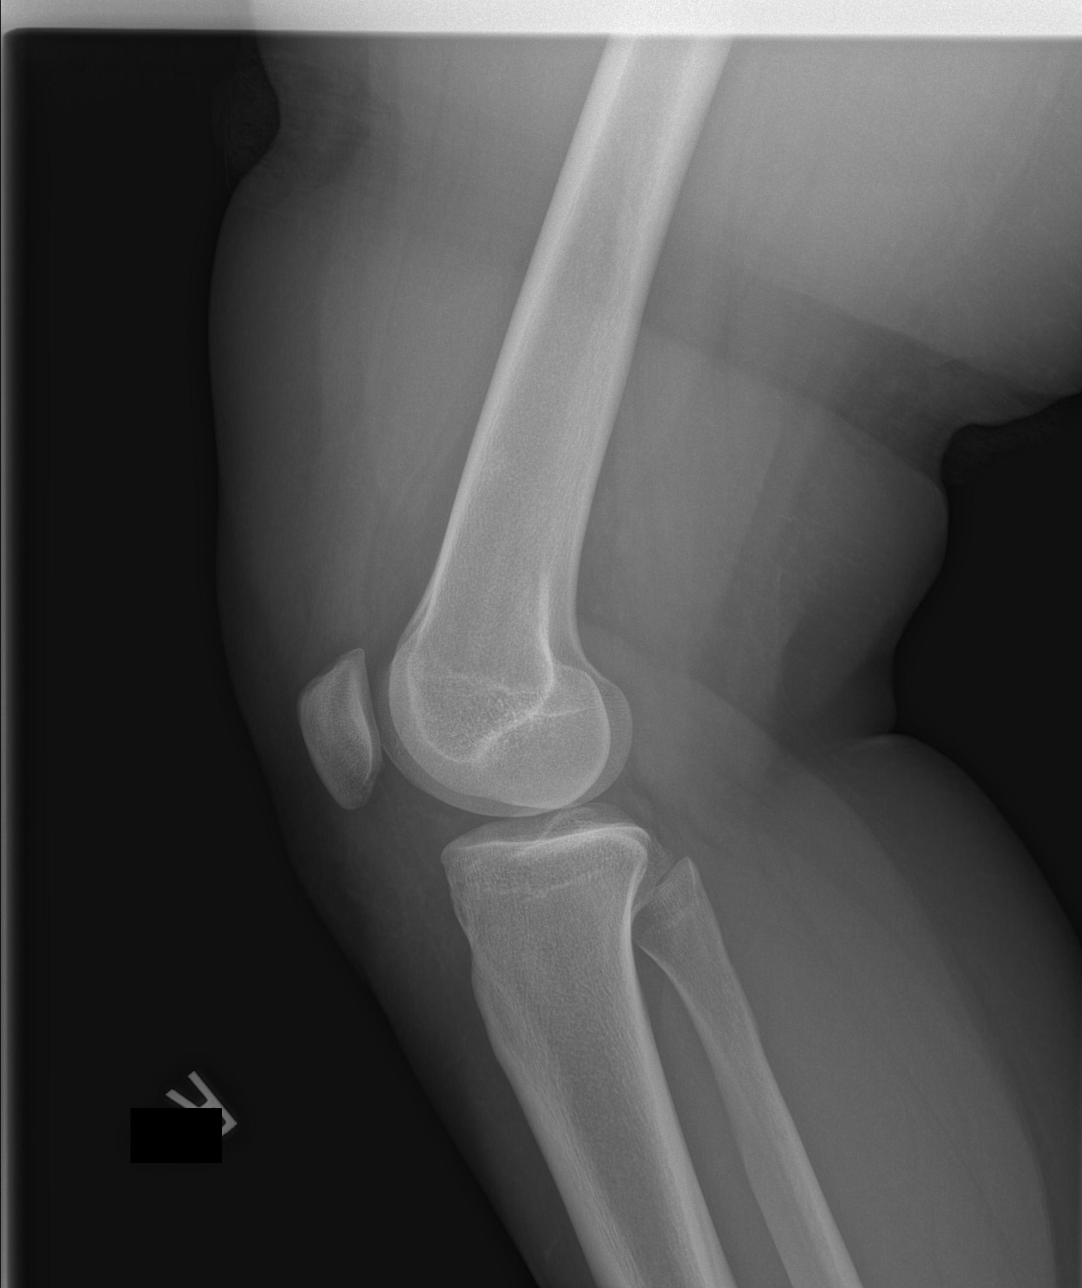

[4 of 4 positions shown; findings below may reference images not displayed]

FINDINGS: Normal bone mineralization. Joint spaces are preserved. No acute
fracture is seen. No dislocation. No joint effusion.
IMPRESSION: Normal right knee radiographs.

## 2024-03-22 IMAGING — CR DG ANKLE COMPLETE 3+V*L*
3 series · 3 of 3 positions shown · non-contrast
Comparison: None Available.

CLINICAL DATA: Lateral left ankle pain after fall last night.

EXAM:
LEFT ANKLE COMPLETE - 3+ VIEW

[x ankle ap left]
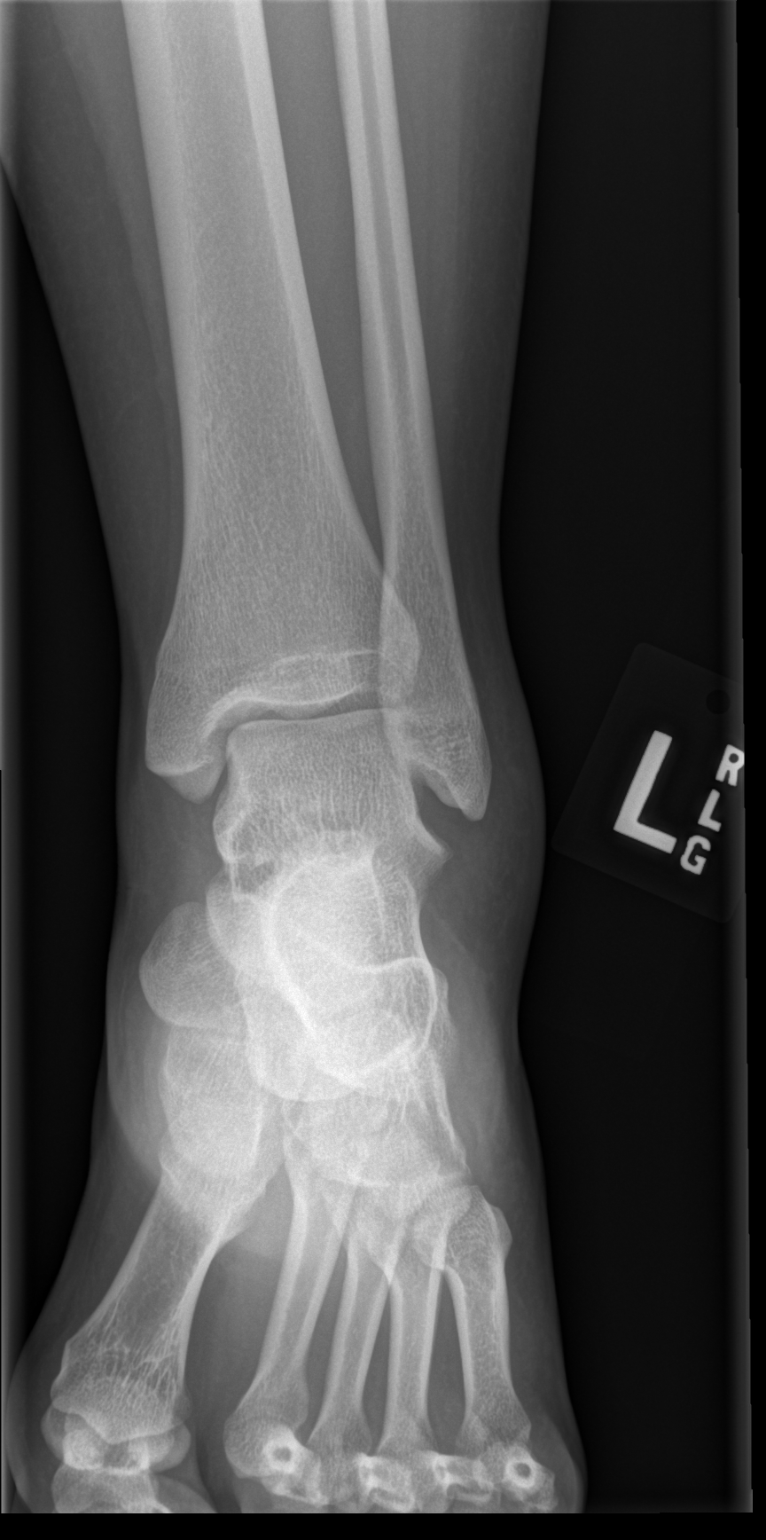

[x ankle obl left]
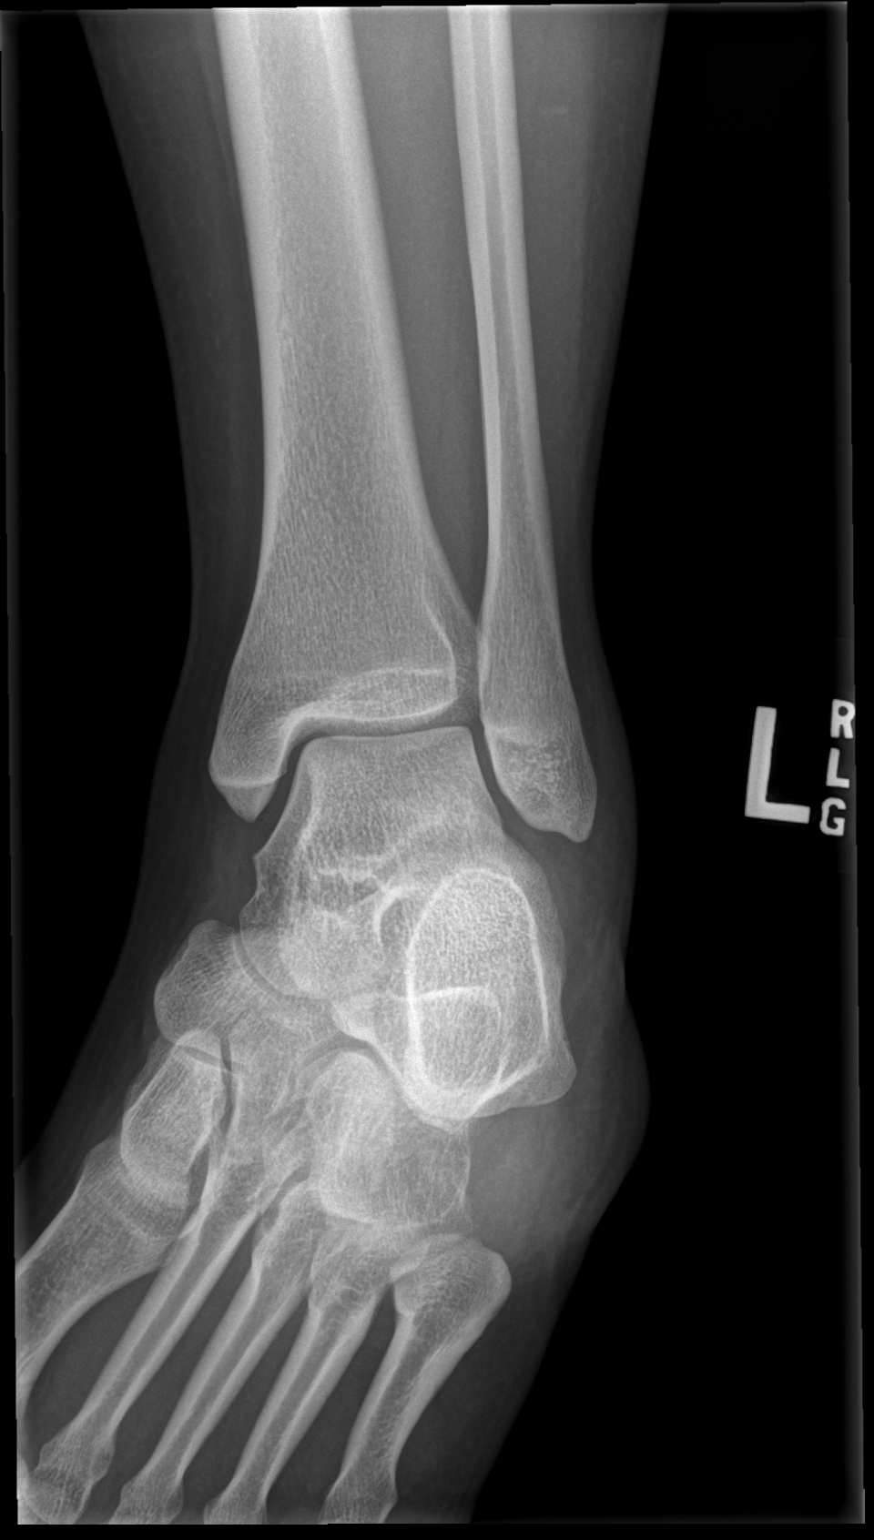

[x ankle lat left]
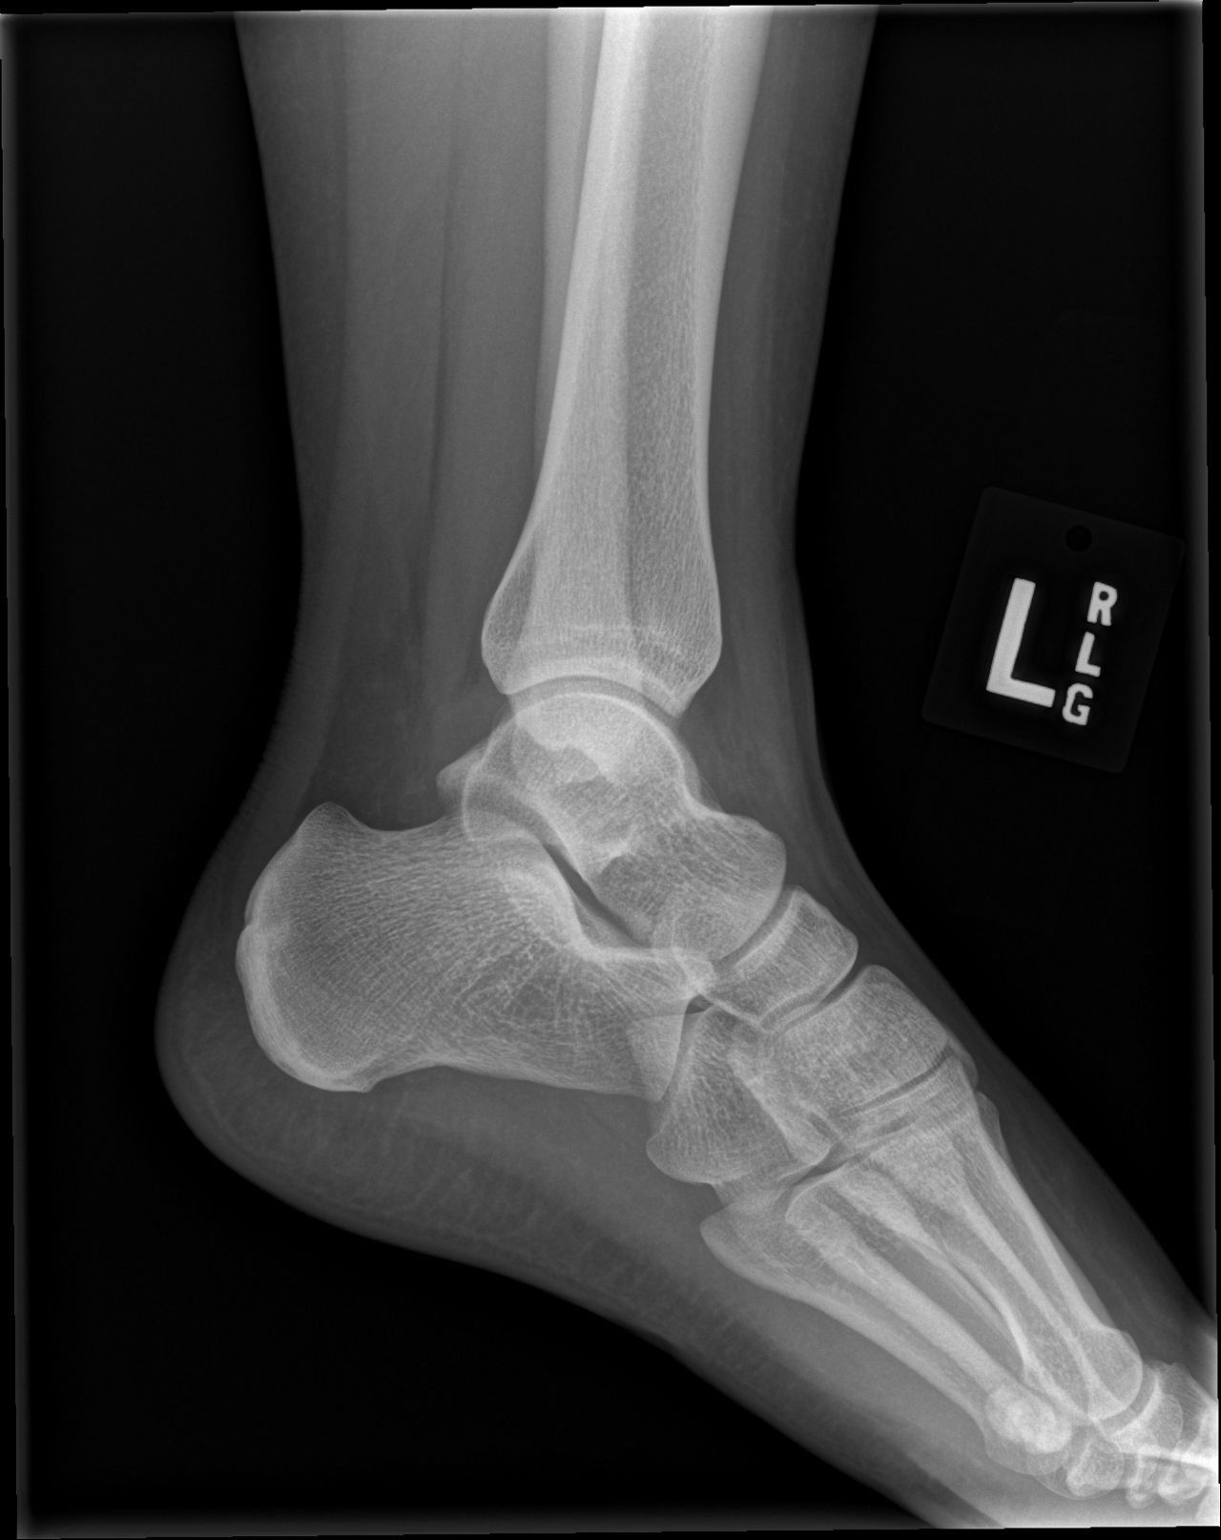

[3 of 3 positions shown; findings below may reference images not displayed]

FINDINGS: Normal bone mineralization. The ankle mortise is symmetric and
intact. Joint spaces are preserved. No acute fracture or
dislocation. Mild lateral malleolar soft tissue swelling.
IMPRESSION: Mild lateral malleolar soft tissue swelling.  No acute fracture.
